# Patient Record
Sex: Female | Born: 2000 | Race: White | Hispanic: No | Marital: Single | Smoking: Never smoker
Health system: Southern US, Community
[De-identification: ages and names within clinical notes are randomized; demographics above are authoritative.]

## PROBLEM LIST (undated history)

## (undated) DIAGNOSIS — J45909 Unspecified asthma, uncomplicated: Secondary | ICD-10-CM

## (undated) DIAGNOSIS — F419 Anxiety disorder, unspecified: Secondary | ICD-10-CM

## (undated) DIAGNOSIS — L309 Dermatitis, unspecified: Secondary | ICD-10-CM

## (undated) HISTORY — PX: TYMPANOSTOMY TUBE PLACEMENT: SHX32

## (undated) HISTORY — DX: Dermatitis, unspecified: L30.9

---
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
## 2022-01-26 ENCOUNTER — Ambulatory Visit (INDEPENDENT_AMBULATORY_CARE_PROVIDER_SITE_OTHER): Payer: Medicaid Other | Admitting: Allergy & Immunology

## 2022-01-26 ENCOUNTER — Encounter: Payer: Self-pay | Admitting: Allergy & Immunology

## 2022-01-26 VITALS — BP 122/76 | HR 98 | Temp 98.9°F | Resp 18 | Ht 64.0 in | Wt 261.2 lb

## 2022-01-26 DIAGNOSIS — J4520 Mild intermittent asthma, uncomplicated: Secondary | ICD-10-CM | POA: Diagnosis not present

## 2022-01-26 DIAGNOSIS — J3089 Other allergic rhinitis: Secondary | ICD-10-CM

## 2022-01-26 DIAGNOSIS — T7800XD Anaphylactic reaction due to unspecified food, subsequent encounter: Secondary | ICD-10-CM

## 2022-01-26 DIAGNOSIS — T7800XA Anaphylactic reaction due to unspecified food, initial encounter: Secondary | ICD-10-CM | POA: Diagnosis not present

## 2022-01-26 MED ORDER — AZELASTINE HCL 0.1 % NA SOLN: 2.0000 | Freq: Two times a day (BID) | NASAL | 5 refills | Status: DC

## 2022-01-26 MED ORDER — MONTELUKAST SODIUM 10 MG PO TABS: 10.0000 mg | ORAL_TABLET | Freq: Every day | ORAL | 5 refills | Status: DC

## 2022-01-26 MED ORDER — CHLORPHENIRAMINE MALEATE ER 12 MG PO TBCR: 12.0000 mg | EXTENDED_RELEASE_TABLET | Freq: Two times a day (BID) | ORAL | 5 refills | Status: DC

## 2022-01-26 MED ORDER — MOMETASONE FUROATE 50 MCG/ACT NA SUSP: 1.0000 | Freq: Every day | NASAL | 5 refills | Status: DC

## 2022-01-26 NOTE — Progress Notes (Signed)
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 Seasonal and perennial allergic rhinitis - Testing today showed: grasses, ragweed, weeds, trees, indoor molds, outdoor molds, dust mites, cat, and dog. - Copy of test results provided.  - Avoidance measures provided. - Stop taking: Flonase and Ryvent - Continue with: Singulair (montelukast) 55m daily - Start taking: Nasonex (momentason) one spray per nostril daily (AIM FOR EAR ON EACH SIDE) and Astelin (azelastine) 2 sprays per nostril 1-2 times daily as needed and ChlorTab 12 mg twice daily  - You can use an extra dose of the antihistamine, if needed, for breakthrough symptoms.  - Consider nasal saline rinses 1-2 times daily to remove allergens from the nasal cavities as well as help with mucous clearance (this is especially helpful to do before the nasal sprays are given) - We will start allergy shots as a means of long-term control. - Allergy shots "re-train" and "reset" the immune system to ignore environmental allergens and decrease the resulting immune response to those allergens (sneezing, itchy watery eyes, runny nose, nasal congestion, etc).    - Allergy shots improve symptoms in 75-85% of patients.   2. 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 - I think that you can introduce this at home without a problem. - we can do a challenge in the office if you are more comfortable with this.   3. Mild intermittent asthma, uncomplicated - Lung testing looked normal today. - We are not going to make any changes at all. - Continue with albuterol as  needed. - We can add medications in the future if needed.  4. Return in about 3 months (around 04/27/2022). Make an appointment to start shots in 3 weeks.    This note in its entirety was forwarded to the Provider who requested this consultation.  Subjective:   Yatziri R Hamblin is a 21y.o. female presenting today for evaluation of  Chief Complaint  Patient presents with   Asthma    Is not bothering    Allergic Rhinitis     Has tried multiple meds and nothing has worked. Has rotated them before. Sneezing, nose stopped, runny nose, headaches, itchy eyes, drainage    Allergy Testing    Dad is allergic to shrimp. Mom avoids giving her shrimp. Has not had shrimp.     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  CBerlinwas referred by HCelene Squibb MD.     CGeralynis a 21y.o. female presenting for an evaluation of environmental allergies .   Asthma/Respiratory Symptom History: Symptoms have not been too terrible. She does endorse some symptoms of SOB when she has congestion. But then she gets drainage and it leads to issues in her lungs. She has been using Vapor rub which has helped.  She first got an inhaler when she was in elementary school. She has been on a number of them over the years.   Allergic Rhinitis Symptom History: She  has had allergies since he was very young. Her best time of the year is the winter. She  can breathe better with the cold air. Symptoms have gotten worse over time. She feels that her symptoms have been particularly bad. She has tried the Ryvent. She takes one tablet at night. 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 She does take Chlortabs every 4 hours, otherwise her symptoms are fairly terrible. This does not knock her out at all. She takes one every morning when she gets up for work. She has  fluticasone that she does not use as recommended. This week has been bad since stopping her medications. She used a NeilMed rinse earlier this week that helped in the moment. But it got worse after that.   She has had skin testing done when she was in [employer] school. She was allergic to a number of items. She had a particularly bad reaction when she was visiting horses and she developed periorbital edema.   She does get antibiotics often for her allergy symptoms. She reports that she would be clear for one month and then it would come.   Food Allergy Symptom History: Her Mom has her avoid shrimp because of a paternal allergy. She does not really like seafood aside from crab legs. She has eaten fin fish in the past but she does not like it.  Mom thinks that she has snuck in some shrimp at some point, but Minahil denies this.   She did have eczema as a child, but this has not been a problem in several years. She had cortisone OTC to treat it. She never saw Dermatology.   Otherwise, there is no history of other atopic diseases, including drug allergies, stinging insect allergies, eczema, urticaria, or contact dermatitis. There is no significant infectious history. Vaccinations are up to date.    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 If you have any questions, ask your nurse or doctor.          STOP taking these medications    Chlorphen 4 MG tablet Generic drug: chlorpheniramine Replaced by: Chlorpheniramine Maleate 12 MG Tbcr Stopped by: Valentina Shaggy, MD       TAKE these medications    acetaminophen 325 MG tablet Commonly  known as: TYLENOL Take 650 mg by mouth every 6 (six) hours as needed for mild pain or headache.   albuterol (2.5 MG/3ML) 0.083% nebulizer solution Commonly known as: PROVENTIL Take 3 mLs (2.5 mg total) by nebulization every 4 (four) hours.   albuterol 108 (90 Base) MCG/ACT inhaler Commonly known as: VENTOLIN HFA Inhale 2 puffs into the lungs every hour as needed for wheezing or shortness of breath.   albuterol 108 (90 Base) MCG/ACT inhaler Commonly known as: VENTOLIN HFA Inhale 1-2 puffs into the lungs every 6 (six) hours as needed for wheezing or shortness of breath.   albuterol (2.5 MG/3ML) 0.083% nebulizer solution Commonly known as: PROVENTIL Take 3 mLs (2.5 mg total) by nebulization every 6 (six) hours as needed for wheezing or shortness of  breath.   Apri 0.15-30 MG-MCG tablet Generic drug: desogestrel-ethinyl estradiol TAKE ONE TABLET BY MOUTH DAILY   azelastine 0.1 % nasal spray Commonly known as: ASTELIN Place 2 sprays into both nostrils 2 (two) times daily. Started by: Valentina Shaggy, MD   beclomethasone 40 MCG/ACT inhaler Commonly known as: QVAR Inhale 2 puffs into the lungs 2 (two) times daily.   cetirizine 10 MG tablet Commonly known as: ZYRTEC Take 10 mg by mouth daily.   Chlorpheniramine Maleate 12 MG Tbcr Take 1 tablet (12 mg total) by mouth in the morning and at bedtime. Replaces: Chlorphen 4 MG tablet Started by: Valentina Shaggy, MD   diphenhydrAMINE 25 MG tablet Commonly known as: SOMINEX Take 25-50 mg by mouth daily.   diphenhydrAMINE 50 MG tablet Commonly known as: BENADRYL Take 50 mg by mouth at bedtime as needed for itching.   HAIR/SKIN/NAILS PO Take by mouth.   levocetirizine 5 MG tablet Commonly known as: XYZAL Take 5 mg by mouth every evening.   levothyroxine 100 MCG tablet Commonly known as: SYNTHROID Take 100 mcg by mouth daily before breakfast.   medroxyPROGESTERone 10 MG tablet Commonly known as: PROVERA Take 1  tablet (10 mg total) by mouth daily.   mometasone 50 MCG/ACT nasal spray Commonly known as: NASONEX Place 1 spray into the nose daily. Started by: Valentina Shaggy, MD   montelukast 10 MG tablet Commonly known as: SINGULAIR Take 1 tablet (10 mg total) by mouth daily.   olopatadine 0.1 % ophthalmic solution Commonly known as: Pataday Place 1 drop into both eyes 2 (two) times daily.   RyVent 6 MG Tabs Generic drug: Carbinoxamine Maleate Take 1 tablet by mouth daily.   Symbicort 80-4.5 MCG/ACT inhaler Generic drug: budesonide-formoterol Inhale 2 puffs into the lungs 2 (two) times daily.        Birth History: born at term without complications. She was born via a planned c/s two weeks before her due date.   Developmental History: Kyrra has met all milestones on time. She has required no speech therapy, occupational therapy, and physical therapy.   Past Surgical History: Past Surgical History:  Procedure Laterality Date   TYMPANOSTOMY TUBE PLACEMENT       Family History: Family History  Problem Relation Age of Onset   Diabetes Mother    Anxiety disorder Mother    Allergic rhinitis Father    Breast cancer Maternal Grandmother    Other Maternal Grandmother        hysterectomy   Kidney failure Maternal Grandmother      Social History: Adiya lives at home with Mom and Dad.  She lives in a house that is 73 years old.  There is hardwood throughout the home.  She has electric heating and central cooling.  There are 2 dogs inside of the home (one pit bull and one dachshund) and rabbits and chickens and turkeys outside of the home.  There are no dust mite covers on the bedding.  There is no tobacco exposure.  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  She is not exposed to fumes, chemicals, or dust.  She does not live near an interstate or industrial area.   Review of Systems  Constitutional: Negative.  Negative for fever, malaise/fatigue and weight  loss.  HENT:  Positive for congestion and sinus pain. Negative for ear discharge and ear pain.        Positive for throat clearing. Positive for postnasal drip.  Eyes:  Negative for pain, discharge and redness.  Respiratory:  Positive for shortness of breath. Negative for cough, sputum production and wheezing.   Cardiovascular: Negative.  Negative for chest pain and palpitations.  Gastrointestinal:  Negative for abdominal pain, heartburn, nausea and vomiting.  Skin: Negative.  Negative for itching and rash.  Neurological:  Negative for dizziness and headaches.  Endo/Heme/Allergies:  Negative for environmental allergies. Does not bruise/bleed easily.       Objective:   Blood pressure 122/76, pulse 98, temperature 98.9 F (37.2 C), resp. rate 18, height 5' 4"  (1.626 m), weight 261 lb 4 oz (118.5 kg), SpO2 96 %. Body mass index is 44.84 kg/m.     Physical Exam Vitals reviewed.  Constitutional:      Appearance: She is well-developed. She is obese.  HENT:     Head: Normocephalic and atraumatic.     Right Ear: Tympanic membrane, ear canal and external ear normal. No drainage, swelling or tenderness. Tympanic membrane is not injected, scarred, erythematous, retracted or bulging.     Left Ear: Tympanic membrane, ear canal and external ear normal. No drainage, swelling or tenderness. Tympanic membrane is not injected, scarred, erythematous, retracted or bulging.     Nose: No nasal deformity, septal deviation, mucosal edema or rhinorrhea.     Right Turbinates: Enlarged, swollen and pale.     Left Turbinates: Enlarged, swollen and pale.     Right Sinus: No maxillary sinus tenderness or frontal sinus tenderness.     Left Sinus: No maxillary sinus tenderness or frontal sinus tenderness.     Mouth/Throat:     Lips: Pink.     Mouth: Mucous membranes are moist. Mucous membranes are not pale and not dry.     Pharynx: Uvula midline.     Comments: Cobblestoning in the posterior oropharynx.  th Eyes:     General:        Right eye: No discharge.        Left eye: No discharge.     Conjunctiva/sclera: Conjunctivae normal.     Right eye: Right conjunctiva is not injected. No chemosis.    Left eye: Left conjunctiva is not injected. No chemosis.    Pupils: Pupils are equal, round, and reactive to light.  Cardiovascular:     Rate and Rhythm: Normal rate and regular rhythm.     Heart sounds: Normal heart sounds.  Pulmonary:     Effort: Pulmonary effort is normal. No tachypnea, accessory muscle usage or respiratory distress.     Breath sounds: Normal breath sounds. No wheezing, rhonchi or rales.     Comments: No wheezing or crackles noted.  Chest:     Chest wall: No tenderness.  Abdominal:     Tenderness: There is no abdominal tenderness. There is no guarding or rebound.  Lymphadenopathy:     Head:     Right side of head: No submandibular, tonsillar or occipital adenopathy.     Left side of head: No submandibular, tonsillar or occipital adenopathy.     Cervical: No cervical adenopathy.  Skin:    Coloration: Skin is not pale.     Findings: No abrasion, erythema, petechiae or rash. Rash is not papular, urticarial or vesicular.  Neurological:     Mental Status: She is alert.  Psychiatric:        Behavior: Behavior is cooperative.      Diagnostic studies:    Spirometry: results normal (FEV1: 2.70/81%, FVC: 3.38/89%, FEV1/FVC: 80%).    Spirometry consistent  with normal pattern.   Allergy Studies:     Airborne Adult Perc - 01/26/22 0928     Time Antigen Placed 6659    Allergen Manufacturer Lavella Hammock    Location Back    Number of Test 59    1. Control-Buffer 50% Glycerol Negative    2. Control-Histamine 1 mg/ml 3+    3. Albumin saline Negative    4. [place] 3+    5. [place] Negative    6. [place] 3+    7. [place] Blue 3+    8. Meadow Fescue Negative    9. Perennial Rye 4+    10. Sweet Vernal 3+    11. Timothy 3+    12. Cocklebur Negative    13. Burweed Marshelder  Negative    14. Ragweed, short Negative    15. Ragweed, Giant Negative    16. Plantain,  English Negative    17. Lamb's Quarters Negative    18. Sheep Sorrell Negative    19. Rough Pigweed Negative    20. Marsh Elder, Rough Negative    21. Mugwort, Common Negative    22. Ash mix Negative    23. Birch mix Negative    24. Beech American Negative    25. Box, Elder Negative    26. Cedar, red Negative    27. Cottonwood, [place] Negative    28. Elm mix Negative    29. Hickory 3+    30. Maple mix Negative    31. Oak, [place] mix Negative    32. Pecan Pollen 4+    33. Pine mix Negative    34. Sycamore Eastern Negative    35. Baldwin, Black Pollen Negative    36. Alternaria alternata Negative    37. Cladosporium Herbarum Negative    38. Aspergillus mix Negative    39. Penicillium mix Negative    40. Bipolaris sorokiniana (Helminthosporium) Negative    41. Drechslera spicifera (Curvularia) Negative    42. Mucor plumbeus 2+    43. Fusarium moniliforme Negative    44. Aureobasidium pullulans (pullulara) Negative    45. Rhizopus oryzae Negative    46. Botrytis cinera Negative    47. Epicoccum nigrum Negative    48. Phoma betae Negative    49. Candida Albicans Negative    50. Trichophyton mentagrophytes Negative    51. Mite, D Farinae  5,000 AU/ml Negative    52. Mite, D Pteronyssinus  5,000 AU/ml Negative    53. Cat Hair 10,000 BAU/ml 3+    54.  Dog Epithelia 3+    55. Mixed Feathers Negative    56. Horse Epithelia Negative    57. Cockroach, German Negative    58. Mouse Negative    59. Tobacco Leaf Negative             Intradermal - 01/26/22 1017     Time Antigen Placed 1017    Allergen Manufacturer Greer    Location Arm    Number of Test 9    Control Negative    Ragweed mix 3+    Weed mix 3+    Tree mix 4+    Mold 1 1+    Mold 4 3+    Cockroach Negative    Mite mix 2+    Comments MM2: 2+             Food Adult Perc - 01/26/22 0900     Time Antigen Placed  9357    Allergen Manufacturer Lavella Hammock  Location Back    Number of allergen test 1    25. Shrimp Negative             Allergy testing results were read and interpreted by myself, documented by clinical staff.         Salvatore Marvel, MD [facility] [place]

## 2022-01-26 NOTE — Patient Instructions (Addendum)
1. Seasonal and perennial allergic rhinitis - Testing today showed: grasses, ragweed, weeds, trees, indoor molds, outdoor molds, dust mites, cat, and dog. - Copy of test results provided.  - Avoidance measures provided. - Stop taking: Flonase and Ryvent - Continue with: Singulair (montelukast) 10mg  daily - Start taking: Nasonex (momentason) one spray per nostril daily (AIM FOR EAR ON EACH SIDE) and Astelin (azelastine) 2 sprays per nostril 1-2 times daily as needed and ChlorTab 12 mg twice daily  - You can use an extra dose of the antihistamine, if needed, for breakthrough symptoms.  - Consider nasal saline rinses 1-2 times daily to remove allergens from the nasal cavities as well as help with mucous clearance (this is especially helpful to do before the nasal sprays are given) - We will start allergy shots as a means of long-term control. - Allergy shots "re-train" and "reset" the immune system to ignore environmental allergens and decrease the resulting immune response to those allergens (sneezing, itchy watery eyes, runny nose, nasal congestion, etc).    - Allergy shots improve symptoms in 75-85% of patients.   2. 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 - I think that you can introduce this at home without a problem. - we can do a challenge in the office if you are more comfortable with this.   3. Mild intermittent asthma, uncomplicated - Lung testing looked normal today. - We are not going to make any changes at all. - Continue with albuterol as needed. - We can add medications in the future if needed.  4. Return in about 3 months (around 04/27/2022). Make an appointment to start shots in 3 weeks.    Please inform 14/10/2021 of any Emergency Department visits, hospitalizations, or changes in symptoms. Call us before going to the ED for breathing or allergy symptoms since we might be able to fit you in for a sick visit. Feel free to contact us anytime with any questions,  problems, or concerns.  It was a pleasure to meet you and your family today!  Websites that have reliable patient information: 1. American Academy of Asthma, Allergy, and Immunology: www.aaaai.org 2. Food Allergy Research and Education (FARE): foodallergy.org 3. Mothers of Asthmatics: http://www.asthmacommunitynetwork.org 4. American College of Allergy, Asthma, and Immunology: www.acaai.org   COVID-19 Vaccine Information can be found at: [place] For questions related to vaccine distribution or appointments, please email [email or web address] or call [phone].   We realize that you might be concerned about having an allergic reaction to the COVID19 vaccines. To help with that concern, WE ARE OFFERING THE COVID19 VACCINES IN OUR OFFICE! Ask the front desk for dates!     "Like" [phone] on Facebook and Instagram for our latest updates!      A healthy democracy works best when [place] participate! Make sure you are registered to vote! If you have moved or changed any of your contact information, you will need to get this updated before voting!  In some cases, you MAY be able to register to vote online: [employer]       Airborne Adult Perc - 01/26/22 0928     Time Antigen Placed 03/28/22    Allergen Manufacturer [id number]    Location Back    Number of Test 59    1. Control-Buffer 50% Glycerol Negative    2. Control-Histamine 1 mg/ml 3+    3. Albumin saline Negative    4. Bahia 3+    5. Waynette Buttery Negative    6.  Johnson 3+    7. Kentucky Blue 3+    8. Meadow Fescue Negative    9. Perennial Rye 4+    10. Sweet Vernal 3+    11. Timothy 3+    12. Cocklebur Negative    13. Burweed Marshelder Negative    14. Ragweed, short Negative    15. Ragweed, Giant Negative    16. Plantain,  English Negative    17. Lamb's Quarters Negative    18. Sheep Sorrell Negative    19. Rough Pigweed  Negative    20. Marsh Elder, Rough Negative    21. Mugwort, Common Negative    22. Ash mix Negative    23. Birch mix Negative    24. Beech American Negative    25. Box, Elder Negative    26. Cedar, red Negative    27. Cottonwood, Guinea-Bissau Negative    28. Elm mix Negative    29. Hickory 3+    30. Maple mix Negative    31. Oak, Guinea-[place]issau mix Negative    32. Pecan Pollen 4+    33. Pine mix Negative    34. Sycamore Eastern Negative    35. Walnut, Black Pollen Negative    36. Alternaria alternata Negative    37. Cladosporium Herbarum Negative    38. Aspergillus mix Negative    39. Penicillium mix Negative    40. Bipolaris sorokiniana (Helminthosporium) Negative    41. Drechslera spicifera (Curvularia) Negative    42. Mucor plumbeus 2+    43. Fusarium moniliforme Negative    44. Aureobasidium pullulans (pullulara) Negative    45. Rhizopus oryzae Negative    46. Botrytis cinera Negative    47. Epicoccum nigrum Negative    48. Phoma betae Negative    49. Candida Albicans Negative    50. Trichophyton mentagrophytes Negative    51. Mite, D Farinae  5,000 AU/ml Negative    52. Mite, D Pteronyssinus  5,000 AU/ml Negative    53. Cat Hair 10,000 BAU/ml 3+    54.  Dog Epithelia 3+    55. Mixed Feathers Negative    56. Horse Epithelia Negative    57. Cockroach, German Negative    58. Mouse Negative    59. Tobacco Leaf Negative             Intradermal - 01/26/22 1017     Time Antigen Placed 1017    Allergen Manufacturer Greer    Location Arm    Number of Test 9    Control Negative    Ragweed mix 3+    Weed mix 3+    Tree mix 4+    Mold 1 1+    Mold 4 3+    Cockroach Negative    Mite mix 2+    Comments MM2: 2+             Food Adult Perc - 01/26/22 0900     Time Antigen Placed 4098    Allergen Manufacturer Waynette Buttery    Location Back    Number of allergen test 1    25. Shrimp Negative             Allergy Shots   Allergies are the result of a chain reaction  that starts in the immune system. Your immune system controls how your body defends itself. For instance, if you have an allergy to pollen, your immune system identifies pollen as an invader or allergen. Your immune system overreacts by producing antibodies called  Immunoglobulin E (IgE). These antibodies travel to cells that release chemicals, causing an allergic reaction.  The concept behind allergy immunotherapy, whether it is received in the form of shots or tablets, is that the immune system can be desensitized to specific allergens that trigger allergy symptoms. Although it requires time and patience, the payback can be long-term relief.  How Do Allergy Shots Work?  Allergy shots work much like a vaccine. Your body responds to injected amounts of a particular allergen given in increasing doses, eventually developing a resistance and tolerance to it. Allergy shots can lead to decreased, minimal or no allergy symptoms.  There generally are two phases: build-up and maintenance. Build-up often ranges from three to six months and involves receiving injections with increasing amounts of the allergens. The shots are typically given once or twice a week, though more rapid build-up schedules are sometimes used.  The maintenance phase begins when the most effective dose is reached. This dose is different for each person, depending on how allergic you are and your response to the build-up injections. Once the maintenance dose is reached, there are longer periods between injections, typically two to four weeks.  Occasionally doctors give cortisone-type shots that can temporarily reduce allergy symptoms. These types of shots are different and should not be confused with allergy immunotherapy shots.  Who Can Be Treated with Allergy Shots?  Allergy shots may be a good treatment approach for people with allergic rhinitis (hay fever), allergic asthma, conjunctivitis (eye allergy) or stinging insect allergy.    Before deciding to begin allergy shots, you should consider:   The length of allergy season and the severity of your symptoms  Whether medications and/or changes to your environment can control your symptoms  Your desire to avoid long-term medication use  Time: allergy immunotherapy requires a major time commitment  Cost: may vary depending on your insurance coverage  Allergy shots for children age 56 and older are effective and often well tolerated. They might prevent the onset of new allergen sensitivities or the progression to asthma.  Allergy shots are not started on patients who are pregnant but can be continued on patients who become pregnant while receiving them. In some patients with other medical conditions or who take certain common medications, allergy shots may be of risk. It is important to mention other medications you talk to your allergist.   When Will I Feel Better?  Some may experience decreased allergy symptoms during the build-up phase. For others, it may take as long as 12 months on the maintenance dose. If there is no improvement after a year of maintenance, your allergist will discuss other treatment options with you.  If you aren't responding to allergy shots, it may be because there is not enough dose of the allergen in your vaccine or there are missing allergens that were not identified during your allergy testing. Other reasons could be that there are high levels of the allergen in your environment or major exposure to non-allergic triggers like tobacco smoke.  What Is the Length of Treatment?  Once the maintenance dose is reached, allergy shots are generally continued for three to five years. The decision to stop should be discussed with your allergist at that time. Some people may experience a permanent reduction of allergy symptoms. Others may relapse and a longer course of allergy shots can be considered.  What Are the Possible Reactions?  The two types of  adverse reactions that can occur with allergy shots are local and  systemic. Common local reactions include very mild redness and swelling at the injection site, which can happen immediately or several hours after. A systemic reaction, which is less common, affects the entire body or a particular body system. They are usually mild and typically respond quickly to medications. Signs include increased allergy symptoms such as sneezing, a stuffy nose or hives.  Rarely, a serious systemic reaction called anaphylaxis can develop. Symptoms include swelling in the throat, wheezing, a feeling of tightness in the chest, nausea or dizziness. Most serious systemic reactions develop within 30 minutes of allergy shots. This is why it is strongly recommended you wait in your doctor's office for 30 minutes after your injections. Your allergist is trained to watch for reactions, and his or her staff is trained and equipped with the proper medications to identify and treat them.  Who Should Administer Allergy Shots?  The preferred location for receiving shots is your prescribing allergist's office. Injections can sometimes be given at another facility where the physician and staff are trained to recognize and treat reactions, and have received instructions by your prescribing allergist.  Reducing Pollen Exposure  The American Academy of Allergy, Asthma and Immunology suggests the following steps to reduce your exposure to pollen during allergy seasons.    Do not hang sheets or clothing out to dry; pollen may collect on these items. Do not mow lawns or spend time around freshly cut grass; mowing stirs up pollen. Keep windows closed at night.  Keep car windows closed while driving. Minimize morning activities outdoors, a time when pollen counts are usually at their highest. Stay indoors as much as possible when pollen counts or humidity is high and on windy days when pollen tends to remain in the air longer. Use air  conditioning when possible.  Many air conditioners have filters that trap the pollen spores. Use a HEPA room air filter to remove pollen form the indoor air you breathe.   Control of Mold Allergen   Mold and fungi can grow on a variety of surfaces provided certain temperature and moisture conditions exist.  Outdoor molds grow on plants, decaying vegetation and soil.  The major outdoor mold, Alternaria and Cladosporium, are found in very high numbers during hot and dry conditions.  Generally, a late Summer - Fall peak is seen for common outdoor fungal spores.  Rain will temporarily lower outdoor mold spore count, but counts rise rapidly when the rainy period ends.  The most important indoor molds are Aspergillus and Penicillium.  Dark, humid and poorly ventilated basements are ideal sites for mold growth.  The next most common sites of mold growth are the bathroom and the kitchen.  Outdoor (Seasonal) Mold Control   Use air conditioning and keep windows closed Avoid exposure to decaying vegetation. Avoid leaf raking. Avoid grain handling. Consider wearing a face mask if working in moldy areas.    Indoor (Perennial) Mold Control    Maintain humidity below 50%. Clean washable surfaces with 5% bleach solution. Remove sources e.g. contaminated carpets.     Control of Dust Mite Allergen    Dust mites play a major role in allergic asthma and rhinitis.  They occur in environments with high humidity wherever human skin is found.  Dust mites absorb humidity from the atmosphere (ie, they do not drink) and feed on organic matter (including shed human and animal skin).  Dust mites are a microscopic type of insect that you cannot see with the naked eye.  High levels of  dust mites have been detected from mattresses, pillows, carpets, upholstered furniture, bed covers, clothes, soft toys and any woven material.  The principal allergen of the dust mite is found in its feces.  A gram of dust may contain  1,000 mites and 250,000 fecal particles.  Mite antigen is easily measured in the air during house cleaning activities.  Dust mites do not bite and do not cause harm to humans, other than by triggering allergies/asthma.    Ways to decrease your exposure to dust mites in your home:  Encase mattresses, box springs and pillows with a mite-impermeable barrier or cover   Wash sheets, blankets and drapes weekly in hot water (130 F) with detergent and dry them in a dryer on the hot setting.  Have the room cleaned frequently with a vacuum cleaner and a damp dust-mop.  For carpeting or rugs, vacuuming with a vacuum cleaner equipped with a high-efficiency particulate air (HEPA) filter.  The dust mite allergic individual should not be in a room which is being cleaned and should wait 1 hour after cleaning before going into the room. Do not sleep on upholstered furniture (eg, couches).   If possible removing carpeting, upholstered furniture and drapery from the home is ideal.  Horizontal blinds should be eliminated in the rooms where the person spends the most time (bedroom, study, television room).  Washable vinyl, roller-type shades are optimal. Remove all non-washable stuffed toys from the bedroom.  Wash stuffed toys weekly like sheets and blankets above.   Reduce indoor humidity to less than 50%.  Inexpensive humidity monitors can be purchased at most hardware stores.  Do not use a humidifier as can make the problem worse and are not recommended.   Control of Dog or Cat Allergen  Avoidance is the best way to manage a dog or cat allergy. If you have a dog or cat and are allergic to dog or cats, consider removing the dog or cat from the home. If you have a dog or cat but don't want to find it a new home, or if your family wants a pet even though someone in the household is allergic, here are some strategies that may help keep symptoms at bay:  Keep the pet out of your bedroom and restrict it to only a few  rooms. Be advised that keeping the dog or cat in only one room will not limit the allergens to that room. Don't pet, hug or kiss the dog or cat; if you do, wash your hands with soap and water. High-efficiency particulate air (HEPA) cleaners run continuously in a bedroom or living room can reduce allergen levels over time. Regular use of a high-efficiency vacuum cleaner or a central vacuum can reduce allergen levels. Giving your dog or cat a bath at least once a week can reduce airborne allergen.

## 2022-01-27 DIAGNOSIS — J3081 Allergic rhinitis due to animal (cat) (dog) hair and dander: Secondary | ICD-10-CM | POA: Diagnosis not present

## 2022-01-27 NOTE — Progress Notes (Signed)
VIALS EXP 01-28-23 

## 2022-01-27 NOTE — Progress Notes (Signed)
Aeroallergen Immunotherapy   Ordering Provider: Dr. Malachi Bonds   Patient Details  Name: Sara Byrd  MRN: [id number]  Date of Birth: 08-27-2000   Order 2 of 2   Vial Label: RW/Molds   0.3 ml (Volume)  1:20 Concentration -- Ragweed Mix  0.2 ml (Volume)  1:20 Concentration -- Alternaria alternata  0.2 ml (Volume)  1:20 Concentration -- Cladosporium herbarum  0.2 ml (Volume)  1:10 Concentration -- Aspergillus mix  0.2 ml (Volume)  1:10 Concentration -- Penicillium mix  0.2 ml (Volume)  1:10 Concentration -- Mucor plumbeus  0.2 ml (Volume)  1:10 Concentration -- Fusarium moniliforme  0.2 ml (Volume)  1:40 Concentration -- Aureobasidium pullulans  0.2 ml (Volume)  1:10 Concentration -- Rhizopus oryzae    1.9  ml Extract Subtotal  3.1  ml Diluent  5.0  ml Maintenance Total   Schedule:  A   Blue Vial (1:100,000): Schedule A (10 doses)  Yellow Vial (1:10,000): Schedule A (10 doses)  Green Vial (1:1,000): Schedule A (10 doses)  Red Vial (1:100): Schedule A (14 doses)   Special Instructions: none

## 2022-01-27 NOTE — Progress Notes (Signed)
Aeroallergen Immunotherapy   Ordering Provider: Dr. Malachi Bonds   Patient Details  Name: Sara Byrd  MRN: [id number]  Date of Birth: [birth year]   Order 1 of 2   Vial Label: G/W/T/C/D   0.3 ml (Volume)  BAU Concentration -- 7 Grass Mix* 100,000 ([address] [place], [place], [place], Perennial Rye, RedTop, Sweet Vernal, Timothy)  0.2 ml (Volume)  1:20 Concentration -- Bahia  0.2 ml (Volume)  1:20 Concentration -- Johnson  0.5 ml (Volume)  1:20 Concentration -- Weed Mix*  0.5 ml (Volume)  1:20 Concentration -- Eastern 10 Tree Mix (also Sweet Gum)  0.2 ml (Volume)  1:10 Concentration -- Hickory*  0.2 ml (Volume)  1:10 Concentration -- Pecan Pollen  0.5 ml (Volume)  1:10 Concentration -- Cat Hair  0.7 ml (Volume)  1:10 Concentration -- Dog Epithelia  0.7 ml (Volume)   AU Concentration -- Mite Mix (DF 5,000 & DP 5,000)    4.0  ml Extract Subtotal  1.0  ml Diluent  5.0  ml Maintenance Total   Schedule:  A   Blue Vial (1:100,000): Schedule A (10 doses)  Yellow Vial (1:10,000): Schedule A (10 doses)  Green Vial (1:1,000): Schedule A (10 doses)  Red Vial (1:100): Schedule A (14 doses)   Special Instructions: none

## 2022-01-28 DIAGNOSIS — J301 Allergic rhinitis due to pollen: Secondary | ICD-10-CM | POA: Diagnosis not present

## 2022-01-31 ENCOUNTER — Other Ambulatory Visit: Payer: Self-pay | Admitting: Obstetrics & Gynecology

## 2022-02-04 ENCOUNTER — Other Ambulatory Visit: Payer: Self-pay | Admitting: *Deleted

## 2022-02-04 MED ORDER — DESOGESTREL-ETHINYL ESTRADIOL 0.15-30 MG-MCG PO TABS: 1.0000 | ORAL_TABLET | Freq: Every day | ORAL | 12 refills | Status: DC

## 2022-02-16 ENCOUNTER — Ambulatory Visit: Payer: Medicaid Other

## 2022-02-18 ENCOUNTER — Ambulatory Visit (INDEPENDENT_AMBULATORY_CARE_PROVIDER_SITE_OTHER): Payer: Medicaid Other

## 2022-02-18 DIAGNOSIS — J309 Allergic rhinitis, unspecified: Secondary | ICD-10-CM

## 2022-02-18 MED ORDER — EPINEPHRINE 0.3 MG/0.3ML IJ SOAJ: 0.3000 mg | INTRAMUSCULAR | 1 refills | Status: AC | PRN

## 2022-02-18 NOTE — Progress Notes (Signed)
Immunotherapy   Patient Details  Name: Sara Byrd MRN: [id number] Date of Birth: 08/10/2000  02/18/2022  [place] started injections for  Rag weeds, molds, grass, weeds, tree, cats , and dogs.  Following schedule: A  Frequency:1 time per week Epi-Pen:Prescription for Epi-Pen given Consent signed previously and patient instructions given. Patient waited for thirty minutes in the lobby without an issue.   Julius Bowels 02/18/2022, 10:02 AM

## 2022-02-23 ENCOUNTER — Ambulatory Visit (INDEPENDENT_AMBULATORY_CARE_PROVIDER_SITE_OTHER): Payer: Medicaid Other

## 2022-02-23 DIAGNOSIS — J309 Allergic rhinitis, unspecified: Secondary | ICD-10-CM | POA: Diagnosis not present

## 2022-03-02 ENCOUNTER — Ambulatory Visit (INDEPENDENT_AMBULATORY_CARE_PROVIDER_SITE_OTHER): Payer: Medicaid Other | Admitting: *Deleted

## 2022-03-02 DIAGNOSIS — J309 Allergic rhinitis, unspecified: Secondary | ICD-10-CM

## 2022-03-09 ENCOUNTER — Ambulatory Visit (INDEPENDENT_AMBULATORY_CARE_PROVIDER_SITE_OTHER): Payer: Medicaid Other

## 2022-03-09 DIAGNOSIS — J309 Allergic rhinitis, unspecified: Secondary | ICD-10-CM | POA: Diagnosis not present

## 2022-03-23 ENCOUNTER — Ambulatory Visit (INDEPENDENT_AMBULATORY_CARE_PROVIDER_SITE_OTHER): Payer: Medicaid Other | Admitting: *Deleted

## 2022-03-23 DIAGNOSIS — J309 Allergic rhinitis, unspecified: Secondary | ICD-10-CM

## 2022-03-30 ENCOUNTER — Ambulatory Visit (INDEPENDENT_AMBULATORY_CARE_PROVIDER_SITE_OTHER): Payer: Medicaid Other

## 2022-03-30 DIAGNOSIS — J309 Allergic rhinitis, unspecified: Secondary | ICD-10-CM

## 2022-04-06 ENCOUNTER — Ambulatory Visit (INDEPENDENT_AMBULATORY_CARE_PROVIDER_SITE_OTHER): Payer: Medicaid Other

## 2022-04-06 DIAGNOSIS — J309 Allergic rhinitis, unspecified: Secondary | ICD-10-CM | POA: Diagnosis not present

## 2022-04-21 ENCOUNTER — Ambulatory Visit: Payer: Medicaid Other | Attending: Cardiology | Admitting: Cardiology

## 2022-04-21 ENCOUNTER — Encounter: Payer: Self-pay | Admitting: Cardiology

## 2022-04-21 VITALS — BP 118/70 | HR 77 | Ht 64.0 in | Wt 250.4 lb

## 2022-04-21 DIAGNOSIS — R002 Palpitations: Secondary | ICD-10-CM

## 2022-04-21 DIAGNOSIS — R000 Tachycardia, unspecified: Secondary | ICD-10-CM

## 2022-04-21 NOTE — Progress Notes (Signed)
Clinical Summary Sara Byrd is a 21 y.o.female seen as a new consult, referred by NP Mare Ferrari for the following medical problems.  1.Tachycardia/Palpitaitons  - 02/2022 TSH normal, K 4.5, Hgb 12.8  - went to urgent care last week.  - at football game had walked up large hill, sat down and heart kept racing. Was also feeling anxious at that time. Some ongoign symptoms overnight.  - high caffeine that day. Grande coffee from [employer], tea, soda that day. - reports long history of anxiety, just started medications last week    2. Anxiety -just started buspar and sertraline    Past Medical History:  Diagnosis Date   Asthma    Eczema      Allergies  Allergen Reactions   Molds & Smuts Other (See Comments)    Asthma  Other reaction(s): Other (See Comments) Asthma    Shrimp Extract Allergy Skin Test      Current Outpatient Medications  Medication Sig Dispense Refill   acetaminophen (TYLENOL) 325 MG tablet Take 650 mg by mouth every 6 (six) hours as needed for mild pain or headache. (Patient not taking: Reported on 01/16/2020)     albuterol (PROVENTIL HFA;VENTOLIN HFA) 108 (90 BASE) MCG/ACT inhaler Inhale 2 puffs into the lungs every hour as needed for wheezing or shortness of breath. 1 Inhaler 0   albuterol (PROVENTIL HFA;VENTOLIN HFA) 108 (90 Base) MCG/ACT inhaler Inhale 1-2 puffs into the lungs every 6 (six) hours as needed for wheezing or shortness of breath. 1 Inhaler 0   albuterol (PROVENTIL) (2.5 MG/3ML) 0.083% nebulizer solution Take 3 mLs (2.5 mg total) by nebulization every 4 (four) hours. 75 mL 0   albuterol (PROVENTIL) (2.5 MG/3ML) 0.083% nebulizer solution Take 3 mLs (2.5 mg total) by nebulization every 6 (six) hours as needed for wheezing or shortness of breath. 75 mL 12   azelastine (ASTELIN) 0.1 % nasal spray Place 2 sprays into both nostrils 2 (two) times daily. 30 mL 5   beclomethasone (QVAR) 40 MCG/ACT inhaler Inhale 2 puffs into the lungs 2 (two) times  daily. (Patient not taking: Reported on 01/02/2020)     Biotin w/ Vitamins C & E (HAIR/SKIN/NAILS PO) Take by mouth.     cetirizine (ZYRTEC) 10 MG tablet Take 10 mg by mouth daily. (Patient not taking: Reported on 01/02/2020)     Chlorpheniramine Maleate 12 MG TBCR Take 1 tablet (12 mg total) by mouth in the morning and at bedtime. 60 tablet 5   desogestrel-ethinyl estradiol (APRI) 0.15-30 MG-MCG tablet Take 1 tablet by mouth daily. 28 tablet 12   diphenhydrAMINE (BENADRYL) 50 MG tablet Take 50 mg by mouth at bedtime as needed for itching. (Patient not taking: Reported on 01/26/2022)     diphenhydrAMINE (SOMINEX) 25 MG tablet Take 25-50 mg by mouth daily. (Patient not taking: Reported on 01/02/2020)     EPINEPHrine (EPIPEN 2-PAK) 0.3 mg/0.3 mL IJ SOAJ injection Inject 0.3 mg into the muscle as needed for anaphylaxis. 2 each 1   levocetirizine (XYZAL) 5 MG tablet Take 5 mg by mouth every evening. (Patient not taking: Reported on 01/26/2022)     levothyroxine (SYNTHROID) 100 MCG tablet Take 100 mcg by mouth daily before breakfast. (Patient not taking: Reported on 01/26/2022)     medroxyPROGESTERone (PROVERA) 10 MG tablet Take 1 tablet (10 mg total) by mouth daily. (Patient not taking: Reported on 01/16/2020) 10 tablet 0   mometasone (NASONEX) 50 MCG/ACT nasal spray Place 1 spray into the nose  daily. 17 g 5   montelukast (SINGULAIR) 10 MG tablet Take 1 tablet (10 mg total) by mouth daily. 30 tablet 5   olopatadine (PATADAY) 0.1 % ophthalmic solution Place 1 drop into both eyes 2 (two) times daily. (Patient not taking: Reported on 01/26/2022) 5 mL 12   RYVENT 6 MG TABS Take 1 tablet by mouth daily.     SYMBICORT 80-4.5 MCG/ACT inhaler Inhale 2 puffs into the lungs 2 (two) times daily.     No current facility-administered medications for this visit.     Past Surgical History:  Procedure Laterality Date   TYMPANOSTOMY TUBE PLACEMENT       Allergies  Allergen Reactions   Molds & Smuts Other (See Comments)     Asthma  Other reaction(s): Other (See Comments) Asthma    Shrimp Extract Allergy Skin Test       Family History  Problem Relation Age of Onset   Diabetes Mother    Anxiety disorder Mother    Allergic rhinitis Father    Breast cancer Maternal Grandmother    Other Maternal Grandmother        hysterectomy   Kidney failure Maternal Grandmother      Social History Ms. Wolanski reports that she has never smoked. She has been exposed to tobacco smoke. She has never used smokeless tobacco. Ms. Raffo reports no history of alcohol use.   Review of Systems CONSTITUTIONAL: No weight loss, fever, chills, weakness or fatigue.  HEENT: Eyes: No visual loss, blurred vision, double vision or yellow sclerae.No hearing loss, sneezing, congestion, runny nose or sore throat.  SKIN: No rash or itching.  CARDIOVASCULAR: per hpi RESPIRATORY: No shortness of breath, cough or sputum.  GASTROINTESTINAL: No anorexia, nausea, vomiting or diarrhea. No abdominal pain or blood.  GENITOURINARY: No burning on urination, no polyuria NEUROLOGICAL: No headache, dizziness, syncope, paralysis, ataxia, numbness or tingling in the extremities. No change in bowel or bladder control.  MUSCULOSKELETAL: No muscle, back pain, joint pain or stiffness.  LYMPHATICS: No enlarged nodes. No history of splenectomy.  PSYCHIATRIC: +anxiety ENDOCRINOLOGIC: No reports of sweating, cold or heat intolerance. No polyuria or polydipsia.  Marland Kitchen   Physical Examination Today's Vitals   04/21/22 1035  BP: 118/70  Pulse: 77  SpO2: 99%  Weight: 250 lb 6.4 oz (113.6 kg)  Height: 5\' 4"  (1.626 m)   Body mass index is 42.98 kg/m.  Gen: resting comfortably, no acute distress HEENT: no scleral icterus, pupils equal round and reactive, no palptable cervical adenopathy,  CV: RRR, no m/r/ gno jvd Resp: Clear to auscultation bilaterally GI: abdomen is soft, non-tender, non-distended, normal bowel sounds, no hepatosplenomegaly MSK:  extremities are warm, no edema.  Skin: warm, no rash Neuro:  no focal deficits Psych: appropriate affect   Diagnostic Studies     Assessment and Plan  1.Tachycardia/palpitations - infrequent episodes typically associated with anxiety, one episode relatively high caffeine intake. - overall symptoms most suggestive of cardaic response to anxiety, do not suspect primary cardiac arrhythmia. If symptoms progress particularly as her anxiety is treated could consider outpatient monitor. Work to limit caffeine. - EKG today shows NSR  F/u 6 months      , M.D..

## 2022-04-21 NOTE — Patient Instructions (Signed)
Medication Instructions:  Continue all current medications.   Labwork: none  Testing/Procedures: none  Follow-Up: 6 months   Any Other Special Instructions Will Be Listed Below (If Applicable).   If you need a refill on your cardiac medications before your next appointment, please call your pharmacy.  

## 2022-04-28 NOTE — Progress Notes (Signed)
[address] [place] [address] Dept: [phone]  FOLLOW UP NOTE  Patient ID: Sara Byrd, female    DOB: 08/29/2000  Age: 21 y.o. MRN: [id number] Date of Office Visit: 04/29/2022  Assessment  Chief Complaint: Allergic Rhinitis  (Have been doing better than before )  HPI Sara Byrd is a 21 year old female who presents to the clinic for follow-up visit.  She was last seen in this clinic on 01/26/2022 by Dr. 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 At today's visit, she reports that her symptoms of allergic rhintiis has been more well controlled since her last visit. She reports symptoms including clear rhinorrhea, nasal congestion, sneezing, and post nasal drainage. She continues montelukast 10 mg once a day, ChlorTab once or twice a day, and occasional azelastine. She is not currently using a saline nasal spray or steroid nasal spray. She continues allergy injections directed toward pollens, mold, and pet with no large or local reactions. She continues to avoid shrimp, however, continues to tolerate food cooked with shrimp with no adverse effects. Asthma is reported as moderately well controlled with shortness of breath as the main symptoms. She reports that she has been experiencing anxiety over the last several months and has experience symptoms including shortness of breath and tachycardia for which she uses beep breathing exercises for resolution. She started using Symbicort once a day about 1-2 months ago without relief of symptoms. She visited Dr. Wyline Mood, cardiology specialist, on 04/21/2022 at which time she had a normal EKG reading. Her current medications are listed in the chart.    Drug Allergies:  Allergies  Allergen Reactions   Molds & Smuts Other (See Comments)    Asthma  Other reaction(s): Other (See Comments) Asthma    Shrimp Extract Allergy Skin  Test     Physical Exam: BP 110/68   Pulse (!) 111   Temp 98 F (36.7 C)   Resp 18   SpO2 98%    Physical Exam Vitals reviewed.  Constitutional:      Appearance: Normal appearance.  HENT:     Head: Normocephalic and atraumatic.     Right Ear: Tympanic membrane normal.     Left Ear: Tympanic membrane normal.     Nose:     Comments: Bilateral nares edematous and pale with clear nasal drainage noted. Pharynx slightly erythematous with no exudate. Ears normal. Eyes normal. Eyes:     Conjunctiva/sclera: Conjunctivae normal.  Cardiovascular:     Rate and Rhythm: Normal rate and regular rhythm.     Heart sounds: Normal heart sounds. No murmur heard. Pulmonary:     Effort: Pulmonary effort is normal.     Breath sounds: Normal breath sounds.     Comments: Lungs clear to auscultaiton Musculoskeletal:        General: Normal range of motion.     Cervical back: Normal range of motion and neck supple.  Skin:    General: Skin is warm and dry.  Neurological:     Mental Status: She is alert and oriented to person, place, and time.  Psychiatric:        Mood and Affect: Mood normal.        Behavior: Behavior normal.        Thought Content: Thought content normal.        Judgment: Judgment normal.     Diagnostics: FVC 3.41, FEV1 2.88.  Predicted  FVC 3.79, predicted FEV1 3.33.  Spirometry indicates normal ventilatory function.  Assessment and Plan: 1. Mild intermittent asthma, uncomplicated   2. Seasonal and perennial allergic rhinitis   3. Anaphylactic shock due to food, subsequent encounter     Meds ordered this encounter  Medications   levalbuterol (XOPENEX HFA) 45 MCG/ACT inhaler    Sig: Inhale 2 puffs into the lungs every 4 (four) hours as needed for wheezing.    Dispense:  15 g    Refill:  1   SYMBICORT 80-4.5 MCG/ACT inhaler    Sig: Inhale 2 puffs into the lungs 2 (two) times daily.    Dispense:  10.2 g    Refill:  5    Patient Instructions  Asthma Continue  montelukast 10 mg once a day to prevent cough or wheeze Begin levalbuterol 2 puffs once every 4 -6 hours as needed for cough or wheeze.  This will replace albuterol For asthma flare, begin Symbicort 80-2 puffs twice a day for 2 weeks or until cough and wheeze free  Allergic rhinitis Continue allergen avoidance measures directed toward pollen, cat, dog, and mold as listed below Continue allergen immunotherapy and have access to an epinephrine autoinjector set Continue Chlortab up to twice a day as needed for runny nose or itch Continue azelastine 2 sprays in each nostril twice a day as needed for runny nose Continue Nasonex 2 sprays in each nostril once a day as needed for stuffy nose Consider saline nasal rinses as needed for nasal symptoms. Use this before any medicated nasal sprays for best result  Food allergy Testing to shrimp was negative at your last visit.  You may introduce this food at home if interested  Call the clinic if this treatment plan is not working well for you.  Follow up in 6 months or sooner if needed.   Return in about 6 months (around 10/29/2022), or if symptoms worsen or fail to improve.    Thank you for the opportunity to care for this patient.  Please do not hesitate to contact me with questions.  Thermon Leyland, FNP [facility]

## 2022-04-29 ENCOUNTER — Encounter: Payer: Self-pay | Admitting: Family Medicine

## 2022-04-29 ENCOUNTER — Other Ambulatory Visit: Payer: Self-pay

## 2022-04-29 ENCOUNTER — Ambulatory Visit (INDEPENDENT_AMBULATORY_CARE_PROVIDER_SITE_OTHER): Payer: Medicaid Other | Admitting: Family Medicine

## 2022-04-29 VITALS — BP 110/68 | HR 111 | Temp 98.0°F | Resp 18

## 2022-04-29 DIAGNOSIS — J4520 Mild intermittent asthma, uncomplicated: Secondary | ICD-10-CM | POA: Insufficient documentation

## 2022-04-29 DIAGNOSIS — T7800XA Anaphylactic reaction due to unspecified food, initial encounter: Secondary | ICD-10-CM | POA: Insufficient documentation

## 2022-04-29 DIAGNOSIS — J302 Other seasonal allergic rhinitis: Secondary | ICD-10-CM | POA: Insufficient documentation

## 2022-04-29 DIAGNOSIS — T7800XD Anaphylactic reaction due to unspecified food, subsequent encounter: Secondary | ICD-10-CM | POA: Diagnosis not present

## 2022-04-29 DIAGNOSIS — J3089 Other allergic rhinitis: Secondary | ICD-10-CM

## 2022-04-29 MED ORDER — SYMBICORT 80-4.5 MCG/ACT IN AERO: 2.0000 | INHALATION_SPRAY | Freq: Two times a day (BID) | RESPIRATORY_TRACT | 5 refills | Status: AC

## 2022-04-29 MED ORDER — LEVALBUTEROL TARTRATE 45 MCG/ACT IN AERO: 2.0000 | INHALATION_SPRAY | RESPIRATORY_TRACT | 1 refills | Status: DC | PRN

## 2022-04-29 NOTE — Patient Instructions (Addendum)
Asthma Continue montelukast 10 mg once a day to prevent cough or wheeze Begin levalbuterol 2 puffs once every 4 -6 hours as needed for cough or wheeze.  This will replace albuterol For asthma flare, begin Symbicort 80-2 puffs twice a day for 2 weeks or until cough and wheeze free  Allergic rhinitis Continue allergen avoidance measures directed toward pollen, cat, dog, and mold as listed below Continue allergen immunotherapy and have access to an epinephrine autoinjector set Continue Chlortab up to twice a day as needed for runny nose or itch Continue azelastine 2 sprays in each nostril twice a day as needed for runny nose Continue Nasonex 2 sprays in each nostril once a day as needed for stuffy nose Consider saline nasal rinses as needed for nasal symptoms. Use this before any medicated nasal sprays for best result  Food allergy Testing to shrimp was negative at your last visit.  You may introduce this food at home if interested  Call the clinic if this treatment plan is not working well for you.  Follow up in 6 months or sooner if needed.  Reducing Pollen Exposure The American Academy of Allergy, Asthma and Immunology suggests the following steps to reduce your exposure to pollen during allergy seasons. Do not hang sheets or clothing out to dry; pollen may collect on these items. Do not mow lawns or spend time around freshly cut grass; mowing stirs up pollen. Keep windows closed at night.  Keep car windows closed while driving. Minimize morning activities outdoors, a time when pollen counts are usually at their highest. Stay indoors as much as possible when pollen counts or humidity is high and on windy days when pollen tends to remain in the air longer. Use air conditioning when possible.  Many air conditioners have filters that trap the pollen spores. Use a HEPA room air filter to remove pollen form the indoor air you breathe.  Control of Dog or Cat Allergen Avoidance is the best  way to manage a dog or cat allergy. If you have a dog or cat and are allergic to dog or cats, consider removing the dog or cat from the home. If you have a dog or cat but don't want to find it a new home, or if your family wants a pet even though someone in the household is allergic, here are some strategies that may help keep symptoms at bay:  Keep the pet out of your bedroom and restrict it to only a few rooms. Be advised that keeping the dog or cat in only one room will not limit the allergens to that room. Don't pet, hug or kiss the dog or cat; if you do, wash your hands with soap and water. High-efficiency particulate air (HEPA) cleaners run continuously in a bedroom or living room can reduce allergen levels over time. Regular use of a high-efficiency vacuum cleaner or a central vacuum can reduce allergen levels. Giving your dog or cat a bath at least once a week can reduce airborne allergen.  Control of Mold Allergen Mold and fungi can grow on a variety of surfaces provided certain temperature and moisture conditions exist.  Outdoor molds grow on plants, decaying vegetation and soil.  The major outdoor mold, Alternaria and Cladosporium, are found in very high numbers during hot and dry conditions.  Generally, a late Summer - Fall peak is seen for common outdoor fungal spores.  Rain will temporarily lower outdoor mold spore count, but counts rise rapidly when the rainy period  ends.  The most important indoor molds are Aspergillus and Penicillium.  Dark, humid and poorly ventilated basements are ideal sites for mold growth.  The next most common sites of mold growth are the bathroom and the kitchen.  Outdoor [employer] Use air conditioning and keep windows closed Avoid exposure to decaying vegetation. Avoid leaf raking. Avoid grain handling. Consider wearing a face mask if working in moldy areas.  Indoor Mold Control Maintain humidity below 50%. Clean washable surfaces with 5% bleach  solution. Remove sources e.g. Contaminated carpets.

## 2022-05-17 ENCOUNTER — Ambulatory Visit
Admission: RE | Admit: 2022-05-17 | Discharge: 2022-05-17 | Disposition: A | Discharge status: Home / Self Care | Payer: Medicaid Other | Source: Ambulatory Visit | Attending: Family Medicine | Admitting: Family Medicine

## 2022-05-17 VITALS — BP 152/92 | HR 102 | Temp 98.6°F | Resp 20

## 2022-05-17 DIAGNOSIS — J4540 Moderate persistent asthma, uncomplicated: Secondary | ICD-10-CM | POA: Diagnosis not present

## 2022-05-17 DIAGNOSIS — J069 Acute upper respiratory infection, unspecified: Secondary | ICD-10-CM | POA: Diagnosis not present

## 2022-05-17 MED ORDER — PROMETHAZINE-DM 6.25-15 MG/5ML PO SYRP: 5.0000 mL | ORAL_SOLUTION | Freq: Four times a day (QID) | ORAL | 0 refills | Status: DC | PRN

## 2022-05-17 MED ORDER — PREDNISONE 50 MG PO TABS: ORAL_TABLET | ORAL | 0 refills | Status: DC

## 2022-05-17 NOTE — ED Triage Notes (Signed)
Pt reports headaches, coughing, mucus in throat and nose, sore throat x 3 days  Took municex and allergy med which gave some relief.

## 2022-05-17 NOTE — Discharge Instructions (Signed)
Take the nasal spray twice daily, continue your allergy and asthma regimen, DayQuil, NyQuil type medications, over-the-counter pain and fever reducers as needed and I have also sent over a good cough syrup.  If your symptoms are worsening or not improving over the next few days you may start the prednisone, particularly if your breathing gets worse.

## 2022-05-17 NOTE — ED Provider Notes (Signed)
[facility]    CSN: [id number] Arrival date & time: 05/17/22  [id number]      History   Chief Complaint Chief Complaint  Patient presents with   Cough    Entered by patient    HPI Sara Byrd is a 21 y.o. female.   Patient presenting today with 3-day history of sinus headache, hacking cough, nasal congestion, sore throat, postnasal drainage, sweats.  Denies fever, body aches, chest pain, shortness of breath, abdominal pain, nausea vomiting or diarrhea.  Taking her typical asthma and allergy regimen as well as Mucinex day and night with mild relief of symptoms.  Numerous sick contacts recently.     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      Home Medications    Prior to Admission medications   Medication Sig Start Date End Date Taking? Authorizing Provider  predniSONE (DELTASONE) 50 MG tablet Take 1 tab with breakfast daily for 3 days 05/17/22  Yes Volney American, PA-C  promethazine-dextromethorphan (PROMETHAZINE-DM) 6.25-15 MG/5ML syrup Take 5 mLs by mouth 4 (four) times daily as needed. 05/17/22  Yes Volney American, PA-C  acetaminophen (TYLENOL) 325 MG tablet Take 650 mg by mouth every 6 (six) hours as needed for mild pain or headache.    [provider]  albuterol (PROVENTIL) (2.5 MG/3ML) 0.083% nebulizer solution Take 3 mLs (2.5 mg total) by nebulization every 4 (four) hours. 04/24/15   Mesner, Corene Cornea, MD  azelastine (ASTELIN) 0.1 % nasal spray Place 2 sprays into both nostrils 2 (two) times daily. 01/26/22   Valentina Shaggy, MD  Biotin w/ Vitamins C & E (HAIR/SKIN/NAILS PO) Take by mouth.    [provider]  busPIRone (BUSPAR) 5 MG tablet Take 1 tablet twice a day by oral route as needed. 04/11/22   [provider]  cetirizine (ZYRTEC) 10 MG tablet Take 10 mg by mouth daily. Patient not taking: Reported on 04/21/2022    [provider]  Chlorpheniramine Maleate 12 MG TBCR Take 1 tablet (12 mg total) by mouth in the morning and at bedtime. 01/26/22   Valentina Shaggy, MD  desogestrel-ethinyl estradiol (APRI) 0.15-30 MG-MCG tablet Take 1 tablet by mouth daily. 02/04/22   Florian Buff, MD  diphenhydrAMINE (BENADRYL) 50 MG tablet Take 50 mg by mouth at bedtime as needed for itching. Patient not taking: Reported on 04/21/2022    [provider]  diphenhydrAMINE (SOMINEX) 25 MG tablet Take 25-50 mg by mouth daily. Patient not taking: Reported on 01/02/2020    [provider]  EPINEPHrine (EPIPEN 2-PAK) 0.3 mg/0.3 mL IJ SOAJ injection Inject 0.3 mg into the muscle as needed for anaphylaxis. 02/18/22   Valentina Shaggy, MD  levalbuterol [facility] HFA) 45 MCG/ACT inhaler Inhale 2 puffs into the lungs every 4 (four) hours as needed for wheezing. 04/29/22   Dara Hoyer, FNP  levocetirizine (XYZAL) 5 MG tablet Take 5 mg by mouth every evening. Patient not taking: Reported on 01/26/2022    [provider]  levothyroxine (SYNTHROID)  100 MCG tablet Take 100 mcg by mouth daily before breakfast. Patient not taking: Reported on 01/26/2022    [provider]  medroxyPROGESTERone (PROVERA) 10 MG tablet Take 1 tablet (10 mg total) by mouth daily. Patient not taking: Reported on 01/16/2020 01/02/20   Cresenzo-Dishmon, Scarlette Calico, CNM  mometasone (NASONEX) 50 MCG/ACT nasal spray Place 1 spray into the nose daily. Patient not taking: Reported on 04/21/2022 01/26/22   Alfonse Spruce, MD  montelukast (SINGULAIR) 10 MG tablet Take 1 tablet (10 mg total) by mouth  daily. 01/26/22   Alfonse Spruce, MD  olopatadine (PATADAY) 0.1 % ophthalmic solution Place 1 drop into both eyes 2 (two) times daily. Patient not taking: Reported on 01/26/2022 10/25/21   Leath-Warren, Sadie Haber, NP  RYVENT 6 MG TABS Take 1 tablet by mouth daily. Patient not taking: Reported on 04/21/2022 01/12/22   [provider]  sertraline (ZOLOFT) 25 MG tablet Take 1 tablet every day by oral route at bedtime. 04/11/22   [provider]  SYMBICORT 80-4.5 MCG/ACT inhaler Inhale 2 puffs into the lungs 2 (two) times daily. 04/29/22   Ambs, Norvel Richards, FNP    Family History Family History  Problem Relation Age of Onset   Diabetes Mother    Anxiety disorder Mother    Allergic rhinitis Father    Breast cancer Maternal Grandmother    Other Maternal Grandmother        hysterectomy   Kidney failure Maternal Grandmother     Social History Social History   Tobacco Use   Smoking status: Never    Passive exposure: Yes   Smokeless tobacco: Never  Substance Use Topics   Alcohol use: No   Drug use: No     Allergies   Molds & smuts and Shrimp extract allergy skin test   Review of Systems Review of Systems Per HPI  Physical Exam Triage Vital Signs ED Triage Vitals  Enc Vitals Group     BP 05/17/22 1020 (!) 152/92     Pulse Rate 05/17/22 1020 (!) 102     Resp 05/17/22 1020 20     Temp 05/17/22 1020 98.6 F (37 C)     Temp Source 05/17/22 1020 Oral     SpO2 05/17/22 1020 98 %     Weight --      Height --      Head Circumference --      Peak Flow --      Pain Score 05/17/22 1026 0     Pain Loc --      Pain Edu? --      Excl. in GC? --    No data found.  Updated Vital Signs BP (!) 152/92 (BP Location: Right Arm)   Pulse (!) 102   Temp 98.6 F (37 C) (Oral)   Resp 20   LMP 04/26/2022 (Exact Date)   SpO2 98%   Visual Acuity Right Eye Distance:   Left Eye Distance:   Bilateral Distance:    Right Eye Near:   Left Eye Near:    Bilateral Near:      Physical Exam Vitals and nursing note reviewed.  Constitutional:      Appearance: Normal appearance.  HENT:     Head: Atraumatic.     Right Ear: Tympanic membrane and external ear normal.     Left Ear: Tympanic membrane and external ear normal.     Nose: Rhinorrhea present.     Mouth/Throat:     Mouth:  Mucous membranes are moist.     Pharynx: Posterior oropharyngeal erythema present.  Eyes:     Extraocular Movements: Extraocular movements intact.     Conjunctiva/sclera: Conjunctivae normal.  Cardiovascular:     Rate and Rhythm: Normal rate and regular rhythm.     Heart sounds: Normal heart sounds.  Pulmonary:     Effort: Pulmonary effort is normal.     Breath sounds: Normal breath sounds. No wheezing or rales.  Musculoskeletal:        General: Normal range of motion.     Cervical back: Normal range of motion and neck supple.  Skin:    General: Skin is warm and dry.  Neurological:     Mental Status: She is alert and oriented to person, place, and time.  Psychiatric:        Mood and Affect: Mood normal.        Thought Content: Thought content normal.      UC Treatments / Results  Labs (all labs ordered are listed, but only abnormal results are displayed) Labs Reviewed - No data to display  EKG   Radiology No results found.  Procedures Procedures (including critical care time)  Medications Ordered in UC Medications - No data to display  Initial Impression / Assessment and Plan / UC Course  I have reviewed the triage vital signs and the nursing notes.  Pertinent labs & imaging results that were available during my care of the patient were reviewed by me and considered in my medical decision making (see chart for details).     Suspect viral respiratory infection, will forego flu testing given testing backorder and declines COVID testing today.  Treat symptoms with Phenergan DM, Flonase, over-the-counter cold congestion medications in addition to allergy and  asthma regimen.  Given her history of asthma prednisone sent in case breathing worsening at any time.  Work note given.  Return for worsening symptoms.  Final Clinical Impressions(s) / UC Diagnoses   Final diagnoses:  Viral URI with cough  Moderate persistent asthma without complication     Discharge Instructions      Take the nasal spray twice daily, continue your allergy and asthma regimen, DayQuil, NyQuil type medications, over-the-counter pain and fever reducers as needed and I have also sent over a good cough syrup.  If your symptoms are worsening or not improving over the next few days you may start the prednisone, particularly if your breathing gets worse.    ED Prescriptions     Medication Sig Dispense Auth. Provider   promethazine-dextromethorphan (PROMETHAZINE-DM) 6.25-15 MG/5ML syrup Take 5 mLs by mouth 4 (four) times daily as needed. 100 mL Volney American, PA-C   predniSONE (DELTASONE) 50 MG tablet Take 1 tab with breakfast daily for 3 days 3 tablet Volney American, [place]      PDMP not reviewed this encounter.   Volney American, [place] 05/17/22 1055

## 2022-05-20 ENCOUNTER — Ambulatory Visit
Admission: EM | Admit: 2022-05-20 | Discharge: 2022-05-20 | Disposition: A | Discharge status: Home / Self Care | Payer: Medicaid Other | Attending: Family Medicine | Admitting: Family Medicine

## 2022-05-20 ENCOUNTER — Telehealth: Payer: Self-pay | Admitting: Emergency Medicine

## 2022-05-20 DIAGNOSIS — J4541 Moderate persistent asthma with (acute) exacerbation: Secondary | ICD-10-CM

## 2022-05-20 DIAGNOSIS — J22 Unspecified acute lower respiratory infection: Secondary | ICD-10-CM

## 2022-05-20 HISTORY — DX: Anxiety disorder, unspecified: F41.9

## 2022-05-20 MED ORDER — AZITHROMYCIN 250 MG PO TABS: ORAL_TABLET | ORAL | 0 refills | Status: DC

## 2022-05-20 NOTE — Telephone Encounter (Signed)
Wants medications sent to [facility].

## 2022-05-20 NOTE — ED Triage Notes (Signed)
Pt reports stuffy nose with constant mucus. Took the prednisone the UC gave her Tuesday, but now feels as though she is wheezing. Took mucinex dm which gave slight relief. Still feels like mucus is in her chest. She says that its green flem coming up.

## 2022-05-20 NOTE — ED Provider Notes (Signed)
[facility]    CSN: [id number] Arrival date & time: 05/20/22  0831      History   Chief Complaint No chief complaint on file.   HPI Sara Byrd is a 21 y.o. female.   Presenting today with about a week of progressively worsening productive cough, chest tightness, chest congestion, continue nasal congestion.  Was diagnosed with viral upper respiratory infection about 3 days ago and given prednisone, cough syrup which she states may have helped her sinuses some but still feeling like things are stuck in her chest.  Has been using her allergy medication, Zicam, Mucinex DM, Symbicort, nebulizer treatments with no relief.  Denies fever, chest pain, severe shortness of breath.  History of asthma, seasonal allergies.    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      Home Medications    Prior to Admission medications   Medication Sig Start Date End Date Taking? Authorizing Provider  acetaminophen (TYLENOL) 325 MG tablet Take 650 mg by mouth every 6 (six) hours as needed for mild pain or headache.    [provider]  albuterol (PROVENTIL) (2.5 MG/3ML) 0.083% nebulizer solution Take 3 mLs (2.5 mg total) by nebulization every 4 (four) hours. 04/24/15   Mesner, Barbara Cower, MD  azelastine (ASTELIN) 0.1 % nasal spray Place 2 sprays into both nostrils 2 (two) times daily. 01/26/22   Alfonse Spruce, MD  azithromycin (ZITHROMAX) 250 MG tablet Take first 2 tablets together, then 1 every day until finished. 05/20/22   Particia Nearing, PA-C   Biotin w/ Vitamins C & E (HAIR/SKIN/NAILS PO) Take by mouth.    [provider]  busPIRone (BUSPAR) 5 MG tablet Take 1 tablet twice a day by oral route as needed. 04/11/22   [provider]  cetirizine (ZYRTEC) 10 MG tablet Take 10 mg by mouth daily. Patient not taking: Reported on 04/21/2022    [provider]  Chlorpheniramine Maleate 12 MG TBCR Take 1 tablet (12 mg total) by mouth in the morning and at bedtime. 01/26/22   Alfonse Spruce, MD  desogestrel-ethinyl estradiol (APRI) 0.15-30 MG-MCG tablet Take 1 tablet by mouth daily. 02/04/22   Lazaro Arms, MD  diphenhydrAMINE (BENADRYL) 50 MG tablet Take 50 mg by mouth at bedtime as needed for itching. Patient not taking: Reported on 04/21/2022    [provider]  diphenhydrAMINE (SOMINEX) 25 MG tablet Take 25-50 mg by mouth daily. Patient not taking: Reported on 01/02/2020    [provider]  EPINEPHrine (EPIPEN 2-PAK) 0.3 mg/0.3 mL IJ SOAJ injection Inject 0.3 mg into the muscle as needed for anaphylaxis. 02/18/22   Alfonse Spruce, MD  levalbuterol [facility] HFA) 45 MCG/ACT inhaler Inhale 2 puffs into the lungs every 4 (four) hours as needed for wheezing. 04/29/22   Hetty Blend, FNP  levocetirizine (XYZAL) 5 MG tablet Take 5 mg by mouth every evening. Patient not taking: Reported on 01/26/2022    [provider]  levothyroxine (SYNTHROID) 100 MCG tablet Take  100 mcg by mouth daily before breakfast. Patient not taking: Reported on 01/26/2022    [provider]  medroxyPROGESTERone (PROVERA) 10 MG tablet Take 1 tablet (10 mg total) by mouth daily. Patient not taking: Reported on 01/16/2020 01/02/20   Cresenzo-Dishmon, Scarlette Calico, CNM  mometasone (NASONEX) 50 MCG/ACT nasal spray Place 1 spray into the nose daily. Patient not taking: Reported on 04/21/2022 01/26/22   Alfonse Spruce, MD  montelukast (SINGULAIR) 10 MG tablet Take 1 tablet (10 mg total) by mouth daily. 01/26/22    Alfonse Spruce, MD  olopatadine (PATADAY) 0.1 % ophthalmic solution Place 1 drop into both eyes 2 (two) times daily. Patient not taking: Reported on 01/26/2022 10/25/21   Leath-Warren, Sadie Haber, NP  predniSONE (DELTASONE) 50 MG tablet Take 1 tab with breakfast daily for 3 days 05/17/22   Particia Nearing, PA-C  promethazine-dextromethorphan (PROMETHAZINE-DM) 6.25-15 MG/5ML syrup Take 5 mLs by mouth 4 (four) times daily as needed. 05/17/22   Particia Nearing, PA-C  RYVENT 6 MG TABS Take 1 tablet by mouth daily. Patient not taking: Reported on 04/21/2022 01/12/22   [provider]  sertraline (ZOLOFT) 25 MG tablet Take 1 tablet every day by oral route at bedtime. 04/11/22   [provider]  SYMBICORT 80-4.5 MCG/ACT inhaler Inhale 2 puffs into the lungs 2 (two) times daily. 04/29/22   Ambs, Norvel Richards, FNP    Family History Family History  Problem Relation Age of Onset   Diabetes Mother    Anxiety disorder Mother    Allergic rhinitis Father    Breast cancer Maternal Grandmother    Other Maternal Grandmother        hysterectomy   Kidney failure Maternal Grandmother     Social History Social History   Tobacco Use   Smoking status: Never    Passive exposure: Yes   Smokeless tobacco: Never  Substance Use Topics   Alcohol use: No   Drug use: No     Allergies   Molds & smuts and Shrimp extract allergy skin test   Review of Systems Review of Systems Per HPI  Physical Exam Triage Vital Signs ED Triage Vitals  Enc Vitals Group     BP 05/20/22 0941 (!) 142/87     Pulse Rate 05/20/22 0941 99     Resp 05/20/22 0941 20     Temp 05/20/22 0941 97.6 F (36.4 C)     Temp Source 05/20/22 0941 Oral     SpO2 05/20/22 0941 98 %     Weight --      Height --      Head Circumference --      Peak Flow --      Pain Score 05/20/22 0945 5     Pain Loc --      Pain Edu? --      Excl. in GC? --    No data found.  Updated Vital Signs BP (!) 142/87    Pulse 99   Temp 97.6 F (36.4 C) (Oral)   Resp 20   LMP 04/26/2022 (Exact Date)   SpO2 98%   Visual Acuity Right Eye Distance:   Left Eye Distance:   Bilateral Distance:    Right Eye Near:   Left Eye Near:    Bilateral Near:     Physical Exam Vitals and nursing note reviewed.  Constitutional:      Appearance: Normal appearance.  HENT:     Head: Atraumatic.     Right  Ear: Tympanic membrane and external ear normal.     Left Ear: Tympanic membrane and external ear normal.     Nose: Congestion present.     Mouth/Throat:     Mouth: Mucous membranes are moist.     Pharynx: Posterior oropharyngeal erythema present.  Eyes:     Extraocular Movements: Extraocular movements intact.     Conjunctiva/sclera: Conjunctivae normal.  Cardiovascular:     Rate and Rhythm: Normal rate and regular rhythm.     Heart sounds: Normal heart sounds.  Pulmonary:     Effort: Pulmonary effort is normal.     Breath sounds: Wheezing present.  Musculoskeletal:        General: Normal range of motion.     Cervical back: Normal range of motion and neck supple.  Skin:    General: Skin is warm and dry.  Neurological:     Mental Status: She is alert and oriented to person, place, and time.  Psychiatric:        Mood and Affect: Mood normal.        Thought Content: Thought content normal.      UC Treatments / Results  Labs (all labs ordered are listed, but only abnormal results are displayed) Labs Reviewed - No data to display  EKG   Radiology No results found.  Procedures Procedures (including critical care time)  Medications Ordered in UC Medications - No data to display  Initial Impression / Assessment and Plan / UC Course  I have reviewed the triage vital signs and the nursing notes.  Pertinent labs & imaging results that were available during my care of the patient were reviewed by me and considered in my medical decision making (see chart for details).     Vital signs overall  reassuring today, exam revealing moderate wheezes and chest congestion despite prednisone, inhalers, neb treatments.  Will start azithromycin to cover for possible developing lower respiratory infection in addition to continuing all the medication she is already taking.  Return for worsening symptoms. Final Clinical Impressions(s) / UC Diagnoses   Final diagnoses:  Lower respiratory infection  Moderate persistent asthma with acute exacerbation   Discharge Instructions   None    ED Prescriptions     Medication Sig Dispense Auth. Provider   azithromycin (ZITHROMAX) 250 MG tablet Take first 2 tablets together, then 1 every day until finished. 6 tablet Particia Nearing, [place]      PDMP not reviewed this encounter.   Particia Nearing, [place] 05/20/22 1003

## 2022-05-23 HISTORY — PX: WISDOM TOOTH EXTRACTION: SHX21

## 2022-06-17 ENCOUNTER — Ambulatory Visit (INDEPENDENT_AMBULATORY_CARE_PROVIDER_SITE_OTHER): Payer: Medicaid Other

## 2022-06-17 DIAGNOSIS — J309 Allergic rhinitis, unspecified: Secondary | ICD-10-CM

## 2022-08-05 ENCOUNTER — Ambulatory Visit (INDEPENDENT_AMBULATORY_CARE_PROVIDER_SITE_OTHER): Payer: Medicaid Other | Admitting: *Deleted

## 2022-08-05 DIAGNOSIS — J309 Allergic rhinitis, unspecified: Secondary | ICD-10-CM

## 2022-08-10 ENCOUNTER — Ambulatory Visit (INDEPENDENT_AMBULATORY_CARE_PROVIDER_SITE_OTHER): Payer: Medicaid Other

## 2022-08-10 DIAGNOSIS — J309 Allergic rhinitis, unspecified: Secondary | ICD-10-CM | POA: Diagnosis not present

## 2022-08-26 ENCOUNTER — Other Ambulatory Visit: Payer: Self-pay | Admitting: Allergy & Immunology

## 2022-09-07 ENCOUNTER — Ambulatory Visit (INDEPENDENT_AMBULATORY_CARE_PROVIDER_SITE_OTHER): Payer: Medicaid Other

## 2022-09-07 DIAGNOSIS — J309 Allergic rhinitis, unspecified: Secondary | ICD-10-CM | POA: Diagnosis not present

## 2022-09-07 NOTE — Progress Notes (Signed)
Immunotherapy   Patient Details  Name: Sara Byrd MRN: [id number] Date of Birth: 11-Dec-2000  09/07/2022  Sara Byrd here to pick up  vials for grasses, weeds, trees, cats, dogs, ragweed's, and molds.  Following schedule: A  Frequency:1 time per week Epi-Pen:Epi-Pen Available  Consent signed previously and patient instructions given as well as paperwork to give to Sara Byrd nurse. Patient was given Sara Byrd dose in office today and was sent home with Sara Byrd vials to take to [place]. Patient waited in office for thirty minutes without an issue.    Ralene Muskrat 09/07/2022, 12:44 PM

## 2022-10-27 ENCOUNTER — Ambulatory Visit (INDEPENDENT_AMBULATORY_CARE_PROVIDER_SITE_OTHER): Payer: Medicaid Other

## 2022-10-27 ENCOUNTER — Encounter: Payer: Self-pay | Admitting: Cardiology

## 2022-10-27 ENCOUNTER — Ambulatory Visit: Payer: Medicaid Other | Attending: Cardiology | Admitting: Cardiology

## 2022-10-27 ENCOUNTER — Other Ambulatory Visit: Payer: Self-pay | Admitting: Cardiology

## 2022-10-27 ENCOUNTER — Telehealth: Payer: Self-pay | Admitting: Cardiology

## 2022-10-27 VITALS — BP 100/66 | HR 92 | Ht 64.0 in | Wt 245.0 lb

## 2022-10-27 DIAGNOSIS — R002 Palpitations: Secondary | ICD-10-CM

## 2022-10-27 DIAGNOSIS — R000 Tachycardia, unspecified: Secondary | ICD-10-CM | POA: Diagnosis not present

## 2022-10-27 NOTE — Patient Instructions (Addendum)
Medication Instructions:  Your physician recommends that you continue on your current medications as directed. Please refer to the Current Medication list given to you today.  Labwork: none  Testing/Procedures: Your physician has recommended that you wear a Zio monitor.   This monitor is a medical device that records the heart's electrical activity. Doctors most often use these monitors to diagnose arrhythmias. Arrhythmias are problems with the speed or rhythm of the heartbeat. The monitor is a small device applied to your chest. You can wear one while you do your normal daily activities. While wearing this monitor if you have any symptoms to push the button and record what you felt. Once you have worn this monitor for the period of time provider prescribed (for 14 days), you will return the monitor device in the postage paid box. Once it is returned they will download the data collected and provide us with a report which the provider will then review and we will call you with those results. Important tips:  Avoid showering during the first 24 hours of wearing the monitor. Avoid excessive sweating to help maximize wear time. Do not submerge the device, no hot tubs, and no swimming pools. Keep any lotions or oils away from the patch. After 24 hours you may shower with the patch on. Take brief showers with your back facing the shower head.  Do not remove patch once it has been placed because that will interrupt data and decrease adhesive wear time. Push the button when you have any symptoms and write down what you were feeling. Once you have completed wearing your monitor, remove and place into box which has postage paid and place in your outgoing mailbox.  If for some reason you have misplaced your box then call our office and we can provide another box and/or mail it off for you.  Follow-Up: Your physician recommends that you schedule a follow-up appointment in: pending  Any Other Special  Instructions Will Be Listed Below (If Applicable).  If you need a refill on your cardiac medications before your next appointment, please call your pharmacy. 

## 2022-10-27 NOTE — Telephone Encounter (Signed)
14 Day ZIO XT dx: palpitations 

## 2022-10-27 NOTE — Progress Notes (Signed)
Clinical Summary Ms. Noxon is a 22 y.o.female  1.Tachycardia/Palpitaitons   - 02/2022 TSH normal, K 4.5, Hgb 12.8  - went to urgent care last week.  -at prior visit reported  at football game had walked up large hill, sat down and heart kept racing. Was also feeling anxious at that time. Some ongoign symptoms overnight.  - high caffeine that day. Grande coffee from [employer], tea, soda that day. - reports long history of anxiety, just started medications last week    -symptoms are doing better but nor esovled. Episodes roughly 1-2 times per month, however some days can have several episodes back to back.  Often comes on with her anxiety, but not always.  -still limiting caffeine.   2. 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  Past Medical History:  Diagnosis Date   Anxiety    Asthma    Eczema      Allergies  Allergen Reactions   Molds & Smuts Other (See Comments)    Asthma  Other reaction(s): Other (See Comments) Asthma    Shrimp Extract      Current Outpatient Medications  Medication Sig Dispense Refill   acetaminophen (TYLENOL) 325 MG tablet Take 650 mg by mouth every 6 (six) hours as needed for mild pain or headache.     albuterol (PROVENTIL) (2.5 MG/3ML) 0.083% nebulizer solution Take 3 mLs (2.5 mg total) by nebulization every 4 (four) hours. 75 mL 0   azelastine (ASTELIN) 0.1 % nasal spray Place 2 sprays into both nostrils 2 (two) times daily. 30 mL 5   azithromycin (ZITHROMAX) 250 MG tablet Take first 2 tablets together, then 1 every day until finished. 6 tablet 0   Biotin w/ Vitamins C & E (HAIR/SKIN/NAILS PO) Take by mouth.     busPIRone (BUSPAR) 5 MG tablet Take 1 tablet twice a day by oral route as needed.     cetirizine (ZYRTEC) 10 MG tablet Take 10 mg by mouth daily. (Patient not taking: Reported on 04/21/2022)     Chlorpheniramine Maleate 12 MG TBCR Take 1 tablet (12 mg total)  by mouth in the morning and at bedtime. 60 tablet 5   desogestrel-ethinyl estradiol (APRI) 0.15-30 MG-MCG tablet Take 1 tablet by mouth daily. 28 tablet 12   diphenhydrAMINE (BENADRYL) 50 MG tablet Take 50 mg by mouth at bedtime as needed for itching. (Patient not taking: Reported on 04/21/2022)     diphenhydrAMINE (SOMINEX) 25 MG tablet Take 25-50 mg by mouth daily. (Patient not taking: Reported on 01/02/2020)     EPINEPHrine (EPIPEN 2-PAK) 0.3 mg/0.3 mL IJ SOAJ injection Inject 0.3 mg into the muscle as needed for anaphylaxis. 2 each 1   levalbuterol (XOPENEX HFA) 45 MCG/ACT inhaler Inhale 2 puffs into the lungs every 4 (four) hours as needed for wheezing. 15 g 1   levocetirizine (XYZAL) 5 MG tablet Take 5 mg by mouth every evening. (Patient not taking: Reported on 01/26/2022)     levothyroxine (SYNTHROID) 100 MCG tablet Take 100 mcg by mouth daily before breakfast. (Patient not taking: Reported on 01/26/2022)     medroxyPROGESTERone (PROVERA) 10 MG tablet Take 1 tablet (10 mg total) by mouth daily. (Patient not taking: Reported on 01/16/2020) 10 tablet 0   mometasone (NASONEX) 50 MCG/ACT nasal spray Place 1 spray into the nose daily. (Patient not taking: Reported on 04/21/2022) 17 g 5   montelukast (SINGULAIR) 10  MG tablet Take 1 tablet by mouth daily. 30 tablet 0   olopatadine (PATADAY) 0.1 % ophthalmic solution Place 1 drop into both eyes 2 (two) times daily. (Patient not taking: Reported on 01/26/2022) 5 mL 12   predniSONE (DELTASONE) 50 MG tablet Take 1 tab with breakfast daily for 3 days 3 tablet 0   promethazine-dextromethorphan (PROMETHAZINE-DM) 6.25-15 MG/5ML syrup Take 5 mLs by mouth 4 (four) times daily as needed. 100 mL 0   RYVENT 6 MG TABS Take 1 tablet by mouth daily. (Patient not taking: Reported on 04/21/2022)     sertraline (ZOLOFT) 25 MG tablet Take 1 tablet every day by oral route at bedtime.     SYMBICORT 80-4.5 MCG/ACT inhaler Inhale 2 puffs into the lungs 2 (two) times daily. 10.2 g 5    No current facility-administered medications for this visit.     Past Surgical History:  Procedure Laterality Date   TYMPANOSTOMY TUBE PLACEMENT       Allergies  Allergen Reactions   Molds & Smuts Other (See Comments)    Asthma  Other reaction(s): Other (See Comments) Asthma    Shrimp Extract       Family History  Problem Relation Age of Onset   Diabetes Mother    Anxiety disorder Mother    Allergic rhinitis Father    Breast cancer Maternal Grandmother    Other Maternal Grandmother        hysterectomy   Kidney failure Maternal Grandmother      Social History Ms. Winegardner reports that she has never smoked. She has been exposed to tobacco smoke. She has never used smokeless tobacco. Ms. Notch reports no history of alcohol use.   Review of Systems CONSTITUTIONAL: No weight loss, fever, chills, weakness or fatigue.  HEENT: Eyes: No visual loss, blurred vision, double vision or yellow sclerae.No hearing loss, sneezing, congestion, runny nose or sore throat.  SKIN: No rash or itching.  CARDIOVASCULAR: per hpi RESPIRATORY: No shortness of breath, cough or sputum.  GASTROINTESTINAL: No anorexia, nausea, vomiting or diarrhea. No abdominal pain or blood.  GENITOURINARY: No burning on urination, no polyuria NEUROLOGICAL: No headache, dizziness, syncope, paralysis, ataxia, numbness or tingling in the extremities. No change in bowel or bladder control.  MUSCULOSKELETAL: No muscle, back pain, joint pain or stiffness.  LYMPHATICS: No enlarged nodes. No history of splenectomy.  PSYCHIATRIC: No history of depression or anxiety.  ENDOCRINOLOGIC: No reports of sweating, cold or heat intolerance. No polyuria or polydipsia.  Marland Kitchen   Physical Examination Today's Vitals   10/27/22 0812  BP: 100/66  Pulse: 92  SpO2: 99%  Weight: 245 lb (111.1 kg)  Height: 5\' 4"  (1.626 m)   Body mass index is 42.05 kg/m.  Gen: resting comfortably, no acute distress HEENT: no scleral  icterus, pupils equal round and reactive, no palptable cervical adenopathy,  CV: RRR, no m/rg, no jvd Resp: Clear to auscultation bilaterally GI: abdomen is soft, non-tender, non-distended, normal bowel sounds, no hepatosplenomegaly MSK: extremities are warm, no edema.  Skin: warm, no rash Neuro:  no focal deficits Psych: appropriate affect   Diagnostic Studies     Assessment and Plan  1.Tachycardia/palpitations - some ongoing symptoms, often related to anxiety but not always. Some days can have multiple episodes back to back. - plan for 14 day zio patch to further evaluate  F/u pending monitor results       Antoine Poche, M.D.

## 2022-10-27 NOTE — Addendum Note (Signed)
Addended by: Eustace Moore on: 10/27/2022 08:46 AM   Modules accepted: Orders

## 2022-10-31 ENCOUNTER — Encounter: Payer: Self-pay | Admitting: Cardiology

## 2022-11-02 ENCOUNTER — Ambulatory Visit: Payer: Medicaid Other | Admitting: Allergy & Immunology

## 2022-11-30 ENCOUNTER — Ambulatory Visit: Payer: Self-pay

## 2022-11-30 ENCOUNTER — Encounter: Payer: Self-pay | Admitting: Family Medicine

## 2022-11-30 ENCOUNTER — Ambulatory Visit (INDEPENDENT_AMBULATORY_CARE_PROVIDER_SITE_OTHER): Payer: Medicaid Other | Admitting: Family Medicine

## 2022-11-30 VITALS — BP 128/78 | HR 100 | Temp 98.3°F | Resp 16 | Ht 64.0 in | Wt 259.2 lb

## 2022-11-30 DIAGNOSIS — T7800XD Anaphylactic reaction due to unspecified food, subsequent encounter: Secondary | ICD-10-CM | POA: Diagnosis not present

## 2022-11-30 DIAGNOSIS — J4520 Mild intermittent asthma, uncomplicated: Secondary | ICD-10-CM | POA: Diagnosis not present

## 2022-11-30 DIAGNOSIS — J302 Other seasonal allergic rhinitis: Secondary | ICD-10-CM | POA: Diagnosis not present

## 2022-11-30 DIAGNOSIS — J3089 Other allergic rhinitis: Secondary | ICD-10-CM | POA: Diagnosis not present

## 2022-11-30 MED ORDER — MONTELUKAST SODIUM 10 MG PO TABS: 10.0000 mg | ORAL_TABLET | Freq: Every day | ORAL | 5 refills | Status: DC

## 2022-11-30 MED ORDER — RYALTRIS 665-25 MCG/ACT NA SUSP: 2.0000 | Freq: Two times a day (BID) | NASAL | 5 refills | Status: DC | PRN

## 2022-11-30 MED ORDER — CARBINOXAMINE MALEATE 4 MG PO TABS: ORAL_TABLET | ORAL | 5 refills | Status: DC

## 2022-11-30 NOTE — Patient Instructions (Addendum)
Asthma Continue montelukast 10 mg once a day to prevent cough or wheeze Continue levalbuterol 2 puffs once every 4 -6 hours as needed for cough or wheeze.  This will replace albuterol For asthma flare, begin Symbicort 80-2 puffs twice a day for 2 weeks or until cough and wheeze free  Allergic rhinitis Continue allergen avoidance measures directed toward pollen, cat, dog, and mold as listed below Continue allergen immunotherapy and have access to an epinephrine autoinjector set Begin carbinoxamine 4 mg tablets. Take 1-2 tablets once or twice a day for nasal symptoms Begin Ryaltris 2 sprays in each nostril twice a day as needed for nasal symptoms Consider saline nasal rinses as needed for nasal symptoms. Use this before any medicated nasal sprays for best result  Call the clinic if this treatment plan is not working well for you.  Follow up in 6 months or sooner if needed.  Reducing Pollen Exposure The American Academy of Allergy, Asthma and Immunology suggests the following steps to reduce your exposure to pollen during allergy seasons. Do not hang sheets or clothing out to dry; pollen may collect on these items. Do not mow lawns or spend time around freshly cut grass; mowing stirs up pollen. Keep windows closed at night.  Keep car windows closed while driving. Minimize morning activities outdoors, a time when pollen counts are usually at their highest. Stay indoors as much as possible when pollen counts or humidity is high and on windy days when pollen tends to remain in the air longer. Use air conditioning when possible.  Many air conditioners have filters that trap the pollen spores. Use a HEPA room air filter to remove pollen form the indoor air you breathe.  Control of Dog or Cat Allergen Avoidance is the best way to manage a dog or cat allergy. If you have a dog or cat and are allergic to dog or cats, consider removing the dog or cat from the home. If you have a dog or cat but don't  want to find it a new home, or if your family wants a pet even though someone in the household is allergic, here are some strategies that may help keep symptoms at bay:  Keep the pet out of your bedroom and restrict it to only a few rooms. Be advised that keeping the dog or cat in only one room will not limit the allergens to that room. Don't pet, hug or kiss the dog or cat; if you do, wash your hands with soap and water. High-efficiency particulate air (HEPA) cleaners run continuously in a bedroom or living room can reduce allergen levels over time. Regular use of a high-efficiency vacuum cleaner or a central vacuum can reduce allergen levels. Giving your dog or cat a bath at least once a week can reduce airborne allergen.  Control of Mold Allergen Mold and fungi can grow on a variety of surfaces provided certain temperature and moisture conditions exist.  Outdoor molds grow on plants, decaying vegetation and soil.  The major outdoor mold, Alternaria and Cladosporium, are found in very high numbers during hot and dry conditions.  Generally, a late Summer - Fall peak is seen for common outdoor fungal spores.  Rain will temporarily lower outdoor mold spore count, but counts rise rapidly when the rainy period ends.  The most important indoor molds are Aspergillus and Penicillium.  Dark, humid and poorly ventilated basements are ideal sites for mold growth.  The next most common sites of mold growth are the  bathroom and the kitchen.  Outdoor [employer] Use air conditioning and keep windows closed Avoid exposure to decaying vegetation. Avoid leaf raking. Avoid grain handling. Consider wearing a face mask if working in moldy areas.  Indoor Mold Control Maintain humidity below 50%. Clean washable surfaces with 5% bleach solution. Remove sources e.g. Contaminated carpets.

## 2022-11-30 NOTE — Progress Notes (Signed)
[address] [address] Dept: [phone]  FOLLOW UP NOTE  Patient ID: Sara Byrd, female    DOB: 2001/02/25  Age: 22 y.o. MRN: [id number] Date of Office Visit: 11/30/2022  Assessment  Chief Complaint: Asthma (Says she is good.) and Allergic Rhinitis  (Says they have gotten a lot better.)  HPI Sara Byrd is a 22 year old female who presents to the clinic for follow-up visit.  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  At today's visit, she reports her asthma has been moderately well-controlled with occasional shortness of breath.  She denies wheeze or cough with rest or activity.  She does report she has episodes of anxiety which also produce shortness of breath.  She continues montelukast 10 mg once a day, Symbicort only as needed for asthma flare, and rarely uses lev albuterol for relief of symptoms.  Allergic rhinitis is reported as moderately well-controlled with symptoms including nasal congestion, clear rhinorrhea, and sneezing.  She continues chlorphentermine once at nighttime with only mild relief of symptoms.  She is not currently using a nasal steroid, nasal antihistamine, or saline nasal rinses.  She continues on allergen immunotherapy directed toward grass pollen, weed pollen, ragweed pollen tree pollen, cat, dog, and mold.  She does report medium size reactions occurring shortly after the injection containing pollens and pets that last for about 1 day.  She reports these occur almost every week.  She denies cardiopulmonary or gastrointestinal symptoms with this local reaction.  She does report that she has pets at home that also sleep with her.  She is not avoiding any foods at this time.  She does not love shrimp but continues to eat some other shellfish.  Her current medications are listed in  the chart.   Drug Allergies:  Allergies  Allergen Reactions   Buspirone Other (See Comments)    Ear ringing, chest pain   Molds & Smuts Cough    Asthma flares up   Shrimp Extract Other (See Comments)    Dad is allergic so patient doesn't eat it.     Physical Exam: BP 128/78   Pulse 100   Temp 98.3 F (36.8 C) (Temporal)   Resp 16   Ht 5\' 4"  (1.626 m)   Wt 259 lb 3.2 oz (117.6 kg)   SpO2 99%   BMI 44.49 kg/m    Physical Exam Vitals reviewed.  Constitutional:      Appearance: Normal appearance.  HENT:     Head: Normocephalic and atraumatic.     Right Ear: Tympanic membrane normal.     Left Ear: Tympanic membrane normal.     Nose:     Comments: Bilateral nares edematous and pale with clear nasal drainage noted.  Pharynx normal.  Ears normal.  Eyes normal.    Mouth/Throat:     Pharynx: Oropharynx is clear.  Eyes:     Conjunctiva/sclera: Conjunctivae normal.  Cardiovascular:     Rate and Rhythm: Normal rate and regular rhythm.     Heart sounds: Normal heart sounds.  Pulmonary:     Effort: Pulmonary effort is normal.     Breath sounds: Normal breath sounds.     Comments: Lungs clear to auscultation Musculoskeletal:        General: Normal range of motion.     Cervical back: Normal range of motion and  neck supple.  Skin:    General: Skin is warm and dry.  Neurological:     Mental Status: She is alert and oriented to person, place, and time.  Psychiatric:        Mood and Affect: Mood normal.        Behavior: Behavior normal.        Thought Content: Thought content normal.        Judgment: Judgment normal.     Diagnostics: FVC 3.78 which is 99% of predicted value, FEV1 2.77 which is 83% of predicted value.  Spirometry indicates possible airway obstruction. On Friday yesterday  Assessment and Plan: 1. Mild intermittent asthma, uncomplicated   2. Seasonal and perennial allergic rhinitis   3. Anaphylactic shock due to food, subsequent encounter     Meds  ordered this encounter  Medications   Carbinoxamine Maleate 4 MG TABS    Sig: Take 1-2 tablets once or twice a day for nasal symptoms    Dispense:  30 tablet    Refill:  5   Olopatadine-Mometasone (RYALTRIS) 665-25 MCG/ACT SUSP    Sig: Place 2 sprays into the nose 2 (two) times daily as needed.    Dispense:  29 g    Refill:  5   montelukast (SINGULAIR) 10 MG tablet    Sig: Take 1 tablet (10 mg total) by mouth daily.    Dispense:  30 tablet    Refill:  5    Patient Instructions  Asthma Continue montelukast 10 mg once a day to prevent cough or wheeze Continue levalbuterol 2 puffs once every 4 -6 hours as needed for cough or wheeze.  This will replace albuterol For asthma flare, begin Symbicort 80-2 puffs twice a day for 2 weeks or until cough and wheeze free  Allergic rhinitis Continue allergen avoidance measures directed toward pollen, cat, dog, and mold as listed below Continue allergen immunotherapy and have access to an epinephrine autoinjector set Begin carbinoxamine 4 mg tablets. Take 1-2 tablets once or twice a day for nasal symptoms Begin Ryaltris 2 sprays in each nostril twice a day as needed for nasal symptoms Consider saline nasal rinses as needed for nasal symptoms. Use this before any medicated nasal sprays for best result  Call the clinic if this treatment plan is not working well for you.  Follow up in 6 months or sooner if needed.   Return in about 6 months (around 06/02/2023), or if symptoms worsen or fail to improve.    Thank you for the opportunity to care for this patient.  Please do not hesitate to contact me with questions.  Thermon Leyland, FNP [facility]

## 2022-12-01 ENCOUNTER — Encounter: Payer: Self-pay | Admitting: *Deleted

## 2022-12-06 ENCOUNTER — Telehealth: Payer: Self-pay

## 2022-12-06 ENCOUNTER — Other Ambulatory Visit (HOSPITAL_COMMUNITY): Payer: Self-pay

## 2022-12-06 NOTE — Telephone Encounter (Signed)
Pharmacy Patient Advocate Encounter  Received notification from  [facility]  that Prior Authorization for Ryaltris 665-25MCG/ACT suspension has been APPROVED from 12-06-2022 to 12-06-2023.Marland Kitchen  PA #/Case ID/Reference #: [id number] Key: [id number]  Copay is $0.00 per 30 day supply. Quantity 29grams per 30 days.

## 2022-12-06 NOTE — Telephone Encounter (Signed)
Pharmacy Patient Advocate Encounter   Received notification from CoverMyMeds that prior authorization for Ryaltris 665-25MCG/ACT suspension is required/requested.   Insurance verification completed.   The patient is insured through  [facility]  .   Per test claim: PA submitted to [employer] via CoverMyMeds Key/confirmation #/EOC  [id number] Status is pending Key: [id number]

## 2022-12-07 NOTE — Telephone Encounter (Addendum)
The patient called back and has been informed.

## 2022-12-07 NOTE — Telephone Encounter (Signed)
I called patient and left a message to call the office back to inform.

## 2023-02-13 ENCOUNTER — Encounter: Payer: Self-pay | Admitting: Nurse Practitioner

## 2023-02-13 ENCOUNTER — Ambulatory Visit: Payer: Medicaid Other | Attending: Nurse Practitioner | Admitting: Nurse Practitioner

## 2023-02-13 VITALS — BP 118/68 | HR 81

## 2023-02-13 DIAGNOSIS — F419 Anxiety disorder, unspecified: Secondary | ICD-10-CM

## 2023-02-13 DIAGNOSIS — R002 Palpitations: Secondary | ICD-10-CM

## 2023-02-13 NOTE — Patient Instructions (Addendum)

## 2023-02-13 NOTE — Progress Notes (Unsigned)
Cardiology Office Note:  .   Date:  02/13/2023 ID:  Rod Can, DOB 03-07-2001, MRN [id number] PCP: Benita Stabile, MD  [facility] Providers Cardiologist:  Dina Rich, MD    History of Present Illness: .   Sara Byrd is a very pleasant 22 y.o. female with a PMH of tachycardia, palpitations, anxiety, who presents today for 6 to 8-week follow-up.  Last seen by Dr. Dina Rich on October 27, 2022.  Patient noted some ongoing palpitations that were often related to her anxiety.  14-day ZIO monitor was arranged-see full report below.  Today she presents for 6 a week follow-up.  She states she still gets palpitations, however admits to caffeine consumption. Feels as though the medications she is on for anxiety are helping. Does get occasional atypical chest pain that says "feels like a panic attack," sees her PCP for her anxiety. Denies any shortness of breath, syncope, presyncope, dizziness, orthopnea, PND, swelling or significant weight changes, acute bleeding, or claudication.  ROS: Negative. See HPI.   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  Studies Reviewed: .    Cardiac monitor 10/2022:   14 day monitor   Rare supraventricular ectopy in the form of isolated PACs, couplets   Rare ventricular ectopy in the form of isolated PVCs   Reported symptoms correlated with normal sinus rhythm   No significant arrhythmias     Patch Wear Time:  13 days and 23 hours (2024-06-06T08:42:38-0400 to 2024-06-20T08:32:00-0400)   Patient had a min HR of 53 bpm, max HR of 168 bpm, and avg HR of 95 bpm. Predominant underlying rhythm was Sinus Rhythm. Isolated SVEs were rare (<1.0%), SVE Couplets were rare (<1.0%), and no SVE Triplets were present. Isolated VEs were rare (<1.0%),  and no VE Couplets or VE Triplets were present.      Physical Exam:   VS:  BP 118/68   Pulse 81   SpO2 99%    Wt Readings from Last 3 Encounters:  11/30/22 259 lb 3.2 oz  (117.6 kg)  10/27/22 245 lb (111.1 kg)  04/21/22 250 lb 6.4 oz (113.6 kg) (>99%, Z= 2.50)*   * Growth percentiles are based on CDC (Girls, 2-20 Years) data.    GEN: Obese, 22 y.o. female in no acute distress NECK: No JVD; No carotid bruits CARDIAC: S1/S2, RRR, no murmurs, rubs, gallops RESPIRATORY:  Clear to auscultation without rales, wheezing or rhonchi  EXTREMITIES:  No edema; No deformity   ASSESSMENT AND PLAN: .    Palpitations Admits to some palpitations but also admits to caffeine consumption and anxiety. Discussed weaning caffeine consumption and continue to follow-up with PCP regarding anxiety management - see below. Monitor overall reassuring. Heart healthy diet and regular cardiovascular exercise encouraged.   Anxiety Continue to follow-up with PCP for management. Support given.   Dispo: Follow-up with Dr. Dina Rich or APP in 1 year or sooner if anything changes.   Signed, Sharlene Dory, NP

## 2023-02-23 ENCOUNTER — Other Ambulatory Visit: Payer: Self-pay | Admitting: Obstetrics & Gynecology

## 2023-02-27 NOTE — Telephone Encounter (Signed)
Pt states she needs her birth control sent to [facility].

## 2023-06-02 ENCOUNTER — Ambulatory Visit: Payer: Medicaid Other | Admitting: Allergy & Immunology

## 2023-06-07 ENCOUNTER — Other Ambulatory Visit: Payer: Self-pay | Admitting: Family Medicine

## 2023-11-15 ENCOUNTER — Encounter (INDEPENDENT_AMBULATORY_CARE_PROVIDER_SITE_OTHER): Payer: Self-pay | Admitting: Otolaryngology

## 2023-11-15 ENCOUNTER — Ambulatory Visit (INDEPENDENT_AMBULATORY_CARE_PROVIDER_SITE_OTHER): Admitting: Otolaryngology

## 2023-11-15 VITALS — BP 127/89 | HR 108

## 2023-11-15 DIAGNOSIS — J3089 Other allergic rhinitis: Secondary | ICD-10-CM | POA: Diagnosis not present

## 2023-11-15 DIAGNOSIS — J342 Deviated nasal septum: Secondary | ICD-10-CM

## 2023-11-15 DIAGNOSIS — R0981 Nasal congestion: Secondary | ICD-10-CM

## 2023-11-15 DIAGNOSIS — J343 Hypertrophy of nasal turbinates: Secondary | ICD-10-CM | POA: Diagnosis not present

## 2023-11-15 DIAGNOSIS — J3489 Other specified disorders of nose and nasal sinuses: Secondary | ICD-10-CM | POA: Diagnosis not present

## 2023-11-15 DIAGNOSIS — R0982 Postnasal drip: Secondary | ICD-10-CM

## 2023-11-15 DIAGNOSIS — J339 Nasal polyp, unspecified: Secondary | ICD-10-CM

## 2023-11-15 NOTE — Progress Notes (Signed)
 ENT CONSULT:  Reason for Consult: concern for nasal polyp   HPI: Discussed the use of AI scribe software for clinical note transcription with the patient, who gave verbal consent to proceed.  History of Present Illness Sara Byrd is a 23 year old female with hx of environmental allergies and asthma who presents with chronic nasal congestion and suspected nasal polyps. She was referred by Dr. Fleeta Smock, her allergy  doctor, for evaluation of suspected nasal polyps.  She experiences chronic nasal congestion, often able to breathe through one nostril but not the other, which she finds confusing. She has had one sinus infection in the past year.  She has a history of allergies to dust and pollen, for which she is currently taking levocetirizine. She also uses montelukast  for her asthma, noting that it helps with her allergies as well. She has been receiving allergy  shots for about a year. She is on Byars.   Her past medical history includes frequent ear infections as a child, which required ear tubes x 1.   Records Reviewed:  Allergy  and Asthma office visit 04/29/22 Sara Byrd is a 23 year old female who presents to the clinic for follow-up visit.  She was last seen in this clinic on 01/26/2022 by Dr. 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 At today's visit, she reports that her symptoms of allergic rhintiis has been more well controlled since her last visit. She reports symptoms including clear rhinorrhea, nasal congestion, sneezing, and post nasal drainage. She continues montelukast  10 mg once a day, ChlorTab once or twice a day, and occasional azelastine . She is not currently using a saline nasal spray or steroid nasal spray. She continues allergy  injections directed toward pollens, mold, and pet with no large or local reactions. She continues to avoid shrimp, however, continues to  tolerate food cooked with shrimp with no adverse effects. Asthma is reported as moderately well controlled with shortness of breath as the main symptoms. She reports that she has been experiencing anxiety over the last several months and has experience symptoms including shortness of breath and tachycardia for which she uses beep breathing exercises for resolution. She started using Symbicort  once a day about 1-2 months ago without relief of symptoms. She visited Dr. Alvan, cardiology specialist, on 04/21/2022 at which time she had a normal EKG reading. Her current medications are listed in the chart.     Past Medical History:  Diagnosis Date   Anxiety    Asthma    Eczema     Past Surgical History:  Procedure Laterality Date   TYMPANOSTOMY TUBE PLACEMENT      Family History  Problem Relation Age of Onset   Diabetes Mother    Anxiety disorder Mother    Allergic rhinitis Father    Breast cancer Maternal Grandmother    Other Maternal Grandmother        hysterectomy   Kidney failure Maternal Grandmother     Social History:  reports that she has never smoked. She has been exposed to tobacco smoke. She has never used smokeless tobacco. She reports that she does not drink alcohol and does not use drugs.  Allergies:  Allergies  Allergen Reactions   Buspirone Other (See Comments)    Ear ringing, chest pain   Molds & Smuts Cough    Asthma flares up   Shrimp Extract Other (See Comments)    Dad is allergic so patient doesn't  eat it.     Medications: I have reviewed the patient's current medications.  The PMH, PSH, Medications, Allergies, and SH were reviewed and updated.  ROS: Constitutional: Negative for fever, weight loss and weight gain. Cardiovascular: Negative for chest pain and dyspnea on exertion. Respiratory: Is not experiencing shortness of breath at rest. Gastrointestinal: Negative for nausea and vomiting. Neurological: Negative for headaches. Psychiatric: The patient  is not nervous/anxious  Blood pressure 127/89, pulse (!) 108, SpO2 98%. There is no height or weight on file to calculate BMI.  PHYSICAL EXAM:  Exam: General: Well-developed, well-nourished Respiratory Respiratory effort: Equal inspiration and expiration without stridor Cardiovascular Peripheral Vascular: Warm extremities with equal color/perfusion Eyes: No nystagmus with equal extraocular motion bilaterally Neuro/Psych/Balance: Patient oriented to person, place, and time; Appropriate mood and affect; Gait is intact with no imbalance; Cranial nerves I-XII are intact Head and Face Inspection: Normocephalic and atraumatic without mass or lesion Palpation: Facial skeleton intact without bony stepoffs Salivary Glands: No mass or tenderness Facial Strength: Facial motility symmetric and full bilaterally ENT Pinna: External ear intact and fully developed External canal: Canal is patent with intact skin Tympanic Membrane: Clear and mobile External Nose: No scar or anatomic deformity Internal Nose: Septum is deviated to the left with narrowing of the nasal passage. One polyp noted in the right middle meatus, no purulence. Mucosal edema and erythema present.  Bilateral inferior turbinate hypertrophy.  Lips, Teeth, and gums: Mucosa and teeth intact and viable TMJ: No pain to palpation with full mobility Oral cavity/oropharynx: No erythema or exudate, no lesions present 3+ tonsils right one slightly more prominent than the left Nasopharynx: No mass or lesion with intact mucosa. Small residual adenoids Neck Neck and Trachea: Midline trachea without mass or lesion Thyroid : No mass or nodularity Lymphatics: No lymphadenopathy  Procedure:   PROCEDURE NOTE: nasal endoscopy  Preoperative diagnosis: chronic sinusitis symptoms  Postoperative diagnosis: same  Procedure: Diagnostic nasal endoscopy (68768)  Surgeon: Elena Larry, M.D.  Anesthesia: Topical lidocaine and Afrin  H&P  REVIEW: The patient's history and physical were reviewed today prior to procedure. All medications were reviewed and updated as well. Complications: None Condition is stable throughout exam Indications and consent: The patient presents with symptoms of chronic sinusitis not responding to previous therapies. All the risks, benefits, and potential complications were reviewed with the patient preoperatively and informed consent was obtained. The time out was completed with confirmation of the correct procedure.   Procedure: The patient was seated upright in the clinic. Topical lidocaine and Afrin were applied to the nasal cavity. After adequate anesthesia had occurred, the rigid nasal endoscope was passed into the nasal cavity. The nasal mucosa, turbinates, septum, and sinus drainage pathways were visualized bilaterally. This revealed no purulence or significant secretions that might be cultured. There was on polyp in the right middle meatus. The mucosa was intact and there was no crusting present. The scope was then slowly withdrawn and the patient tolerated the procedure well. There were no complications or blood loss.   Assessment/Plan: Encounter Diagnoses  Name Primary?   Environmental and seasonal allergies Yes   Chronic nasal congestion    Post-nasal drip    Hypertrophy of both inferior nasal turbinates    Nasal septal deviation    Nasal polyp     Assessment and Plan Assessment & Plan Chronic nasal congestion environmental allergies and nasal obstruction. Evidence of a small right nasal polyp noted in the right middle meatus. Left side visualization limited by swelling and  septal deviation. Surgery discussed if polyps grow or increase in number. She only had one sinus infection in the past 12 months, will hold off on CT sinuses.  - Consider nasal saline rinses - Continue montelukast  and levocetirizine and Xhane - Consult Dr. Fleeta Smock on adjuvant medications such as Dupixent or Xolair -  Monitor for increased congestion or sinus infections; consider imaging if symptoms worsen.  Allergic rhinitis Environmental Allergies Chronic allergic rhinitis managed with levocetirizine, montelukast , and allergy  shots. - Continue levocetirizine and Xhance - Continue allergy  shots.  Deviated nasal septum ITH Septal deviation causing nasal obstruction. No immediate intervention planned but if sx persist will consider septo/ITR    Thank you for allowing me to participate in the care of this patient. Please do not hesitate to contact me with any questions or concerns.   Elena Larry, MD [facility] Phone: [phone] Fax: [phone]    11/15/2023, 2:49 PM

## 2024-01-05 ENCOUNTER — Telehealth: Payer: Self-pay | Admitting: Cardiology

## 2024-01-05 ENCOUNTER — Encounter: Payer: Self-pay | Admitting: Cardiology

## 2024-01-05 NOTE — Telephone Encounter (Signed)
 Patient responded via mychart.

## 2024-01-05 NOTE — Telephone Encounter (Signed)
 Patient c/o Palpitations:  STAT if patient reporting lightheadedness, shortness of breath, or chest pain  How long have you had palpitations/irregular HR/ Afib? Are you having the symptoms now?   No  Are you currently experiencing lightheadedness, SOB or CP?  Random chest pains  Do you have a history of afib (atrial fibrillation) or irregular heart rhythm?   Yes  Have you checked your BP or HR? (document readings if available):   No  Are you experiencing any other symptoms?   No   Patient stated she has been having palpitations, random chest pains and acid reflux.  Patient noted she has asthma which reduces when she uses her inhaler.

## 2024-01-05 NOTE — Telephone Encounter (Signed)
 Left message for patient to call back and provide more information before sending to the provider.

## 2024-01-29 ENCOUNTER — Ambulatory Visit: Attending: Nurse Practitioner

## 2024-01-29 ENCOUNTER — Ambulatory Visit: Attending: Nurse Practitioner | Admitting: Nurse Practitioner

## 2024-01-29 ENCOUNTER — Encounter: Payer: Self-pay | Admitting: Nurse Practitioner

## 2024-01-29 ENCOUNTER — Other Ambulatory Visit: Payer: Self-pay | Admitting: Nurse Practitioner

## 2024-01-29 VITALS — BP 118/72 | HR 76 | Ht 64.0 in | Wt 301.0 lb

## 2024-01-29 DIAGNOSIS — Z13220 Encounter for screening for lipoid disorders: Secondary | ICD-10-CM

## 2024-01-29 DIAGNOSIS — F419 Anxiety disorder, unspecified: Secondary | ICD-10-CM

## 2024-01-29 DIAGNOSIS — R202 Paresthesia of skin: Secondary | ICD-10-CM

## 2024-01-29 DIAGNOSIS — R42 Dizziness and giddiness: Secondary | ICD-10-CM

## 2024-01-29 DIAGNOSIS — R6889 Other general symptoms and signs: Secondary | ICD-10-CM

## 2024-01-29 DIAGNOSIS — R002 Palpitations: Secondary | ICD-10-CM | POA: Diagnosis present

## 2024-01-29 DIAGNOSIS — R0789 Other chest pain: Secondary | ICD-10-CM

## 2024-01-29 NOTE — Progress Notes (Addendum)
 Cardiology Office Note:  .   Date:  01/29/2024 ID:  Sara Byrd, DOB 2001/02/01, MRN [id number] PCP: Leonce Lucie JINNY DEVONNA  [facility] Providers Cardiologist:  Alvan Carrier, MD    History of Present Illness: .   Sara Byrd is a very pleasant 23 y.o. female with a PMH of tachycardia, palpitations, anxiety, who presents today for scheduled follow-up.  Last seen by Dr. Carrier Alvan on October 27, 2022.  Patient noted some ongoing palpitations that were often related to her anxiety.  14-day ZIO monitor was arranged-see full report below.  02/13/2023 - Today she presents for 6 a week follow-up.  She states she still gets palpitations, however admits to caffeine consumption. Feels as though the medications she is on for anxiety are helping. Does get occasional atypical chest pain that says feels like a panic attack, sees her PCP for her anxiety. Denies any shortness of breath, syncope, presyncope, dizziness, orthopnea, PND, swelling or significant weight changes, acute bleeding, or claudication.  01/29/2024 -she is here for follow-up.  Still continues to note some infrequent palpitations, noticed with taking deep breaths, says she will notice that it flutters out.  Notices some weird chest pains noticed along the left side and along her back.  Does wonder if this is due to some acid reflux, has taken omeprazole over-the-counter that seems to help.  Difficult for her to describe these episodes.  No family history of CVD to her knowledge.  She is scheduled to see OB/GYN next month.  Does notice tingling along her right extremity/arm and as well as chest and face, notices feeling cold at times.  Thyroid  disease does run in her family.  Also tells me she has a family history of high cholesterol.  ROS: Negative. See HPI.   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  Studies Reviewed: SABRA    EKG:  EKG Interpretation Date/Time:  Monday January 29 2024 13:36:01 EDT Ventricular Rate:  85 PR Interval:  130 QRS Duration:  88 QT Interval:  378 QTC Calculation: 449 R Axis:   65  Text Interpretation: Normal sinus rhythm Normal ECG No previous ECGs available Confirmed by Miriam Norris [phone]) on 01/29/2024 1:49:22 PM     Cardiac monitor 10/2022:   14 day monitor   Rare supraventricular ectopy in the form of isolated PACs, couplets   Rare ventricular ectopy in the form of isolated PVCs   Reported symptoms correlated with normal sinus rhythm   No significant arrhythmias     Patch Wear Time:  13 days and 23 hours (2024-06-06T08:42:38-0400 to 2024-06-20T08:32:00-0400)   Patient had a min HR of 53 bpm, max HR of 168 bpm, and avg HR of 95 bpm. Predominant underlying rhythm was Sinus Rhythm. Isolated SVEs were rare (<1.0%), SVE Couplets were rare (<1.0%), and no SVE Triplets were present. Isolated VEs were rare (<1.0%),  and no VE Couplets or VE Triplets were present.      Physical Exam:   VS:  BP 118/72   Pulse 76   Ht 5' 4 (1.626 m)   Wt (!) 301 lb (136.5 kg)   SpO2 97%   BMI 51.67 kg/m    Wt Readings from Last 3 Encounters:  01/29/24 (!) 301 lb (136.5 kg)  11/30/22 259 lb 3.2 oz (117.6 kg)  10/27/22 245 lb (111.1 kg)    GEN: Obese, 23 y.o. female in no acute distress NECK: No JVD; No carotid bruits CARDIAC: S1/S2, RRR,  no murmurs, rubs, gallops RESPIRATORY:  Clear to auscultation without rales, wheezing or rhonchi  EXTREMITIES:  No edema; No deformity   ASSESSMENT AND PLAN: .    Atypical chest pains Does not sound cardiac related, atypical in etiology. No indication for ischemic evaluation at this time. Will continue to monitor. Heart healthy diet and regular cardiovascular exercise encouraged. Continue to follow with PCP.  Palpitations, tingling, cold sensation Admits to some palpitations. Will obtain Zio monitor. Heart healthy diet and regular cardiovascular exercise encouraged. Will obtain CBC, CMET, Mag, and thyroid   panel.   Anxiety Continue to follow-up with PCP for management. Support given.   4. Screening for HLD Does have a FH. Will obtain FLP.  Dispo: Follow-up with Dr. Dorn Ross or APP in 6-8 weeks or sooner if anything changes.   Signed, Almarie Crate, NP

## 2024-01-29 NOTE — Patient Instructions (Addendum)
 Medication Instructions:  Your physician recommends that you continue on your current medications as directed. Please refer to the Current Medication list given to you today.  Labwork: In 1-2 weeks with [facility]   Testing/Procedures: ZIO- Long Term Monitor Instructions   Your physician has requested you wear your ZIO patch monitor 14 days.   This is a single patch monitor.  [employer] supplies one patch monitor per enrollment.  Additional stickers are not available.   Please do not apply patch if you will be having a Nuclear Stress Test, Echocardiogram, Cardiac CT, MRI, or Chest Xray during the time frame you would be wearing the monitor. The patch cannot be worn during these tests.  You cannot remove and re-apply the ZIO XT patch monitor.   Your ZIO patch monitor will be sent USPS Priority mail from [facility] directly to your home address. The monitor may also be mailed to a PO BOX if home delivery is not available.   It may take 3-5 days to receive your monitor after you have been enrolled.   Once you have received you monitor, please review enclosed instructions.  Your monitor has already been registered assigning a specific monitor serial # to you.   Applying the monitor   Shave hair from upper left chest.   Hold abrader disc by orange tab.  Rub abrader in 40 strokes over left upper chest as indicated in your monitor instructions.   Clean area with 4 enclosed alcohol pads .  Use all pads to assure are is cleaned thoroughly.  Let dry.   Apply patch as indicated in monitor instructions.  Patch will be place under collarbone on left side of chest with arrow pointing upward.   Rub patch adhesive wings for 2 minutes.Remove white label marked 1.  Remove white label marked 2.  Rub patch adhesive wings for 2 additional minutes.   While looking in a mirror, press and release button in center of patch.  A small green light will flash 3-4 times .  This will be your only indicator  the monitor has been turned on.     Do not shower for the first 24 hours.  You may shower after the first 24 hours.   Press button if you feel a symptom. You will hear a small click.  Record Date, Time and Symptom in the Patient Log Book.   When you are ready to remove patch, follow instructions on last 2 pages of Patient Log Book.  Stick patch monitor onto last page of Patient Log Book.   Place Patient Log Book in [place] box.  Use locking tab on box and tape box closed securely.  The [employer] has [employer] on it.  Please place in mailbox as soon as possible.  Your physician should have your test results approximately 7 days after the monitor has been mailed back to [facility].   Call [facility] Customer Care at [phone] if you have questions regarding your ZIO XT patch monitor.  Call them immediately if you see an orange light blinking on your monitor.   If your monitor falls off in less than 4 days contact our Monitor department at [phone].  If your monitor becomes loose or falls off after 4 days call [employer] at [phone] for suggestions on securing your monitor.  Follow-Up: Your physician recommends that you schedule a follow-up appointment in: 6-8 weeks   Any Other Special Instructions Will Be Listed Below (If Applicable).  If you  need a refill on your cardiac medications before your next appointment, please call your pharmacy.

## 2024-02-01 ENCOUNTER — Ambulatory Visit: Payer: Self-pay | Admitting: Nurse Practitioner

## 2024-02-01 LAB — LIPID PANEL
Chol/HDL Ratio: 2.1 ratio (ref 0.0–4.4)
Cholesterol, Total: 171 mg/dL (ref 100–199)
HDL: 81 mg/dL (ref >39)
LDL Chol Calc (NIH): 74 mg/dL (ref 0–99)
Triglycerides: 88 mg/dL (ref 0–149)
VLDL Cholesterol Cal: 16 mg/dL (ref 5–40)

## 2024-02-01 LAB — MAGNESIUM: Magnesium: 2.1 mg/dL (ref 1.6–2.3)

## 2024-02-01 LAB — CBC
Hematocrit: 38.5 % (ref 34.0–46.6)
Hemoglobin: 12.1 g/dL (ref 11.1–15.9)
MCH: 26.3 pg — ABNORMAL LOW (ref 26.6–33.0)
MCHC: 31.4 g/dL — ABNORMAL LOW (ref 31.5–35.7)
MCV: 84 fL (ref 79–97)
Platelets: 350 x10E3/uL (ref 150–450)
RBC: 4.6 x10E6/uL (ref 3.77–5.28)
RDW: 13 % (ref 11.7–15.4)
WBC: 10.7 x10E3/uL (ref 3.4–10.8)

## 2024-02-01 LAB — COMPREHENSIVE METABOLIC PANEL WITH GFR
ALT: 6 IU/L (ref 0–32)
AST: 8 IU/L (ref 0–40)
Albumin: 3.7 g/dL — ABNORMAL LOW (ref 4.0–5.0)
Alkaline Phosphatase: 101 IU/L (ref 44–121)
BUN/Creatinine Ratio: 16 (ref 9–23)
BUN: 10 mg/dL (ref 6–20)
Bilirubin Total: 0.3 mg/dL (ref 0.0–1.2)
CO2: 19 mmol/L — ABNORMAL LOW (ref 20–29)
Calcium: 8.9 mg/dL (ref 8.7–10.2)
Chloride: 106 mmol/L (ref 96–106)
Creatinine, Ser: 0.62 mg/dL (ref 0.57–1.00)
Globulin, Total: 2.7 g/dL (ref 1.5–4.5)
Glucose: 77 mg/dL (ref 70–99)
Potassium: 4.7 mmol/L (ref 3.5–5.2)
Sodium: 138 mmol/L (ref 134–144)
Total Protein: 6.4 g/dL (ref 6.0–8.5)
eGFR: 130 mL/min/1.73 (ref >59)

## 2024-02-01 LAB — TSH+FREE T4
Free T4: 0.91 ng/dL (ref 0.82–1.77)
TSH: 3 u[IU]/mL (ref 0.450–4.500)

## 2024-02-19 DIAGNOSIS — R42 Dizziness and giddiness: Secondary | ICD-10-CM | POA: Diagnosis not present

## 2024-02-26 ENCOUNTER — Ambulatory Visit: Payer: Self-pay | Admitting: Nurse Practitioner

## 2024-03-04 ENCOUNTER — Ambulatory Visit: Admitting: Nurse Practitioner

## 2024-03-13 ENCOUNTER — Other Ambulatory Visit: Payer: Self-pay | Admitting: Obstetrics & Gynecology

## 2024-03-19 ENCOUNTER — Encounter: Payer: Self-pay | Admitting: Adult Health

## 2024-03-19 ENCOUNTER — Other Ambulatory Visit (HOSPITAL_COMMUNITY)
Admission: RE | Admit: 2024-03-19 | Discharge: 2024-03-19 | Disposition: A | Discharge status: Home / Self Care | Source: Ambulatory Visit | Attending: Obstetrics & Gynecology | Admitting: Obstetrics & Gynecology

## 2024-03-19 ENCOUNTER — Ambulatory Visit (INDEPENDENT_AMBULATORY_CARE_PROVIDER_SITE_OTHER): Admitting: Adult Health

## 2024-03-19 VITALS — BP 123/80 | HR 101 | Ht 64.0 in | Wt 300.5 lb

## 2024-03-19 DIAGNOSIS — Z01419 Encounter for gynecological examination (general) (routine) without abnormal findings: Secondary | ICD-10-CM | POA: Diagnosis not present

## 2024-03-19 DIAGNOSIS — F419 Anxiety disorder, unspecified: Secondary | ICD-10-CM | POA: Diagnosis not present

## 2024-03-19 DIAGNOSIS — Z0000 Encounter for general adult medical examination without abnormal findings: Secondary | ICD-10-CM | POA: Insufficient documentation

## 2024-03-19 DIAGNOSIS — F32A Depression, unspecified: Secondary | ICD-10-CM | POA: Diagnosis not present

## 2024-03-19 DIAGNOSIS — Z3041 Encounter for surveillance of contraceptive pills: Secondary | ICD-10-CM

## 2024-03-19 MED ORDER — SERTRALINE HCL 50 MG PO TABS: 50.0000 mg | ORAL_TABLET | Freq: Every day | ORAL | 0 refills | Status: AC

## 2024-03-19 MED ORDER — APRI 0.15-30 MG-MCG PO TABS: 1.0000 | ORAL_TABLET | Freq: Every day | ORAL | 12 refills | Status: AC

## 2024-03-19 NOTE — Progress Notes (Signed)
 Patient ID: Sara Byrd, female   DOB: 03-21-2001, 23 y.o.   MRN: [id number] History of Present Illness: Sara Byrd is a 23 year old white female,single, G0P0, in for a well woman gyn exam and first pap. She needs refill on Apri  and enough zoloft til sees new PCP Monday.  PCP is Dr Dartha   Current Medications, Allergies, Past Medical History, Past Surgical History, Family History and Social History were reviewed in [employer] electronic medical record.     Review of Systems: Patient denies any daily headaches(does have occasionally), hearing loss, fatigue, blurred vision, shortness of breath,  abdominal pain, problems with bowel movements, urination, or intercourse. No joint pain or mood swings.  Has had chest pain and saw cardiologist and thought to be anxiety related.  Feels angry and irritable at times.  She is happy with Apri .   Physical Exam:BP 123/80 (BP Location: Left Arm, Patient Position: Sitting, Cuff Size: Large)   Pulse (!) 101   Ht 5' 4 (1.626 m)   Wt (!) 300 lb 8 oz (136.3 kg)   LMP 03/13/2024 (Approximate)   BMI 51.58 kg/m   General:  Well developed, well nourished, no acute distress Skin:  Warm and dry Neck:  Midline trachea, normal thyroid , good ROM, no lymphadenopathy Lungs; Clear to auscultation bilaterally Breast:  No dominant palpable mass, retraction, or nipple discharge Cardiovascular: Regular rate and rhythm Abdomen:  Soft, non tender, no hepatosplenomegaly Pelvic:  External genitalia is normal in appearance, no lesions.  The vagina is normal in appearance. Urethra has no lesions or masses. The cervix is nulliparous, pap with GC/CHL performed.SABRA  Uterus is felt to be normal size, shape, and contour.  No adnexal masses or tenderness noted.Bladder is non tender, no masses felt. Extremities/musculoskeletal:  No swelling or varicosities noted, no clubbing or cyanosis Psych:  No mood changes, alert and cooperative,seems happy AA is 1 Fall risk is low     03/19/2024    3:35 PM  Depression screen PHQ 2/9  Decreased Interest 1  Down, Depressed, Hopeless 0  PHQ - 2 Score 1  Altered sleeping 2  Tired, decreased energy 2  Change in appetite 1  Feeling bad or failure about yourself  0  Trouble concentrating 1  Moving slowly or fidgety/restless 1  Suicidal thoughts 0  PHQ-9 Score 8       03/19/2024    3:35 PM  GAD 7 : Generalized Anxiety Score  Nervous, Anxious, on Edge 3  Control/stop worrying 2  Worry too much - different things 2  Trouble relaxing 1  Restless 0  Easily annoyed or irritable 3  Afraid - awful might happen 1  Total GAD 7 Score 12    Upstream - 03/19/24 1542       Pregnancy Intention Screening   Does the patient want to become pregnant in the next year? No    Does the patient's partner want to become pregnant in the next year? No    Would the patient like to discuss contraceptive options today? No      Contraception Wrap Up   Current Method Oral Contraceptive    End Method Oral Contraceptive    Contraception Counseling Provided Yes           Examination chaperoned by Clarita Salt LPN   Impression and plan: 1. Routine general medical examination at a health care facility (Primary) Pap sent Pap in 3 years if negative  Physical in 1 year  - Cytology - PAP( [facility])  2. Anxiety and depression Refilled zoloft 50 mg 1 daily Meds ordered this encounter  Medications   sertraline (ZOLOFT) 50 MG tablet    Sig: Take 1 tablet (50 mg total) by mouth daily.    Dispense:  30 tablet    Refill:  0    Supervising Provider:   JAYNE MINDER H [[id number]]   desogestrel -ethinyl estradiol  (APRI ) 0.15-30 MG-MCG tablet    Sig: Take 1 tablet by mouth daily.    Dispense:  28 tablet    Refill:  12    This prescription was filled on 02/19/2024. Any refills authorized will be placed on file.    Supervising Provider:   JAYNE MINDER DEL [[id number]]   Follow up with PCP and talk about anxiety more with her  3. Encounter for  surveillance of contraceptive pills Happy with Apri , will refill

## 2024-03-22 ENCOUNTER — Ambulatory Visit: Payer: Self-pay | Admitting: Adult Health

## 2024-03-22 LAB — CYTOLOGY - PAP
Adequacy: ABSENT
Chlamydia: NEGATIVE
Comment: NEGATIVE
Comment: NORMAL
Diagnosis: NEGATIVE
Neisseria Gonorrhea: NEGATIVE

## 2024-03-29 ENCOUNTER — Ambulatory Visit: Admitting: Nurse Practitioner

## 2024-03-29 NOTE — Progress Notes (Deleted)
 Cardiology Office Note:  .   Date:  01/29/2024 ID:  Sara Byrd, DOB [birth year], MRN [id number] PCP: Dartha Geralds, DO  [facility] Providers Cardiologist:  Alvan Carrier, MD    History of Present Illness: .   Sara Byrd is a very pleasant 23 y.o. female with a PMH of tachycardia, palpitations, anxiety, who presents today for scheduled follow-up.  Last seen by Dr. Carrier Alvan on October 27, 2022.  Patient noted some ongoing palpitations that were often related to her anxiety.  14-day ZIO monitor was arranged-see full report below.  02/13/2023 - Today she presents for 6 a week follow-up.  She states she still gets palpitations, however admits to caffeine consumption. Feels as though the medications she is on for anxiety are helping. Does get occasional atypical chest pain that says feels like a panic attack, sees her PCP for her anxiety. Denies any shortness of breath, syncope, presyncope, dizziness, orthopnea, PND, swelling or significant weight changes, acute bleeding, or claudication.  01/29/2024 -she is here for follow-up.  Still continues to note some infrequent palpitations, noticed with taking deep breaths, says she will notice that it flutters out.  Notices some weird chest pains noticed along the left side and along her back.  Does wonder if this is due to some acid reflux, has taken omeprazole over-the-counter that seems to help.  Difficult for her to describe these episodes.  No family history of CVD to her knowledge.  She is scheduled to see OB/GYN next month.  Does notice tingling along her right extremity/arm and as well as chest and face, notices feeling cold at times.  Thyroid  disease does run in her family.  Also tells me she has a family history of high cholesterol.  ROS: Negative. See HPI.   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  Studies Reviewed: SABRA    EKG:        Cardiac monitor 10/2022:   14 day monitor   Rare  supraventricular ectopy in the form of isolated PACs, couplets   Rare ventricular ectopy in the form of isolated PVCs   Reported symptoms correlated with normal sinus rhythm   No significant arrhythmias     Patch Wear Time:  13 days and 23 hours (2024-06-06T08:42:38-0400 to 2024-06-20T08:32:00-0400)   Patient had a min HR of 53 bpm, max HR of 168 bpm, and avg HR of 95 bpm. Predominant underlying rhythm was Sinus Rhythm. Isolated SVEs were rare (<1.0%), SVE Couplets were rare (<1.0%), and no SVE Triplets were present. Isolated VEs were rare (<1.0%),  and no VE Couplets or VE Triplets were present.      Physical Exam:   VS:  LMP 03/13/2024 (Approximate)    Wt Readings from Last 3 Encounters:  03/19/24 (!) 300 lb 8 oz (136.3 kg)  01/29/24 (!) 301 lb (136.5 kg)  11/30/22 259 lb 3.2 oz (117.6 kg)    GEN: Obese, 23 y.o. female in no acute distress NECK: No JVD; No carotid bruits CARDIAC: S1/S2, RRR, no murmurs, rubs, gallops RESPIRATORY:  Clear to auscultation without rales, wheezing or rhonchi  EXTREMITIES:  No edema; No deformity   ASSESSMENT AND PLAN: .    Atypical chest pains Does not sound cardiac related, atypical in etiology. No indication for ischemic evaluation at this time. Will continue to monitor. Heart healthy diet and regular cardiovascular exercise encouraged. Continue to follow with PCP.  Palpitations, tingling, cold sensation Admits to some palpitations. Will obtain Zio  monitor. Heart healthy diet and regular cardiovascular exercise encouraged. Will obtain CBC, CMET, Mag, and thyroid  panel.   Anxiety Continue to follow-up with PCP for management. Support given.   4. Screening for HLD Does have a FH. Will obtain FLP.  Dispo: Follow-up with Dr. Dorn Ross or APP in 6-8 weeks or sooner if anything changes.   Signed, Almarie Crate, NP

## 2024-06-06 ENCOUNTER — Ambulatory Visit: Attending: Cardiology | Admitting: Cardiology

## 2024-06-06 ENCOUNTER — Encounter: Payer: Self-pay | Admitting: Cardiology

## 2024-06-06 VITALS — BP 124/72 | HR 94 | Ht 64.0 in | Wt 310.8 lb

## 2024-06-06 DIAGNOSIS — R0789 Other chest pain: Secondary | ICD-10-CM | POA: Insufficient documentation

## 2024-06-06 DIAGNOSIS — R002 Palpitations: Secondary | ICD-10-CM | POA: Insufficient documentation

## 2024-06-06 NOTE — Progress Notes (Signed)
 "     Clinical Summary Ms. Grandpre is a 24 y.o.female seen today for follow up of the following medical problems.   1.Tachycardia/Palpitaitons   - 02/2022 TSH normal, K 4.5, Hgb 12.8  - went to urgent care last week.  -at prior visit reported  at football game had walked up large hill, sat down and heart kept racing. Was also feeling anxious at that time. Some ongoign symptoms overnight.  - high caffeine that day. Grande coffee from [employer], tea, soda that day. - reports long history of anxiety, just started medications last week    -symptoms are doing better but nor esovled. Episodes roughly 1-2 times per month, however some days can have several episodes back to back.  Often comes on with her anxiety, but not always.  -still limiting caffeine.   01/2024 6 day monitor; Rare ectopy, symptoms correlated with SR and mild sinus tach  - no recent heart fluttering - feels like anxiety has been well controlled. Takes zoloft  daily, hydroxyzine takes prn, needs  -still high caffeine intake    2. Chest pain occaisonal chest pains at times - can be right sided or left sided. Sharp pain, 3-4/10 in severity. Last few minutes. Can occur at rest or with activity. No other associated symptoms. Better with movement or position, better with deep breathing.     3. 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     Past Medical History:  Diagnosis Date   Anxiety    Asthma    Eczema      Allergies[1]   Current Outpatient Medications  Medication Sig Dispense Refill   albuterol  (PROVENTIL ) (2.5 MG/3ML) 0.083% nebulizer solution Take 3 mLs (2.5 mg total) by nebulization every 4 (four) hours. 75 mL 0   desogestrel -ethinyl estradiol  (APRI ) 0.15-30 MG-MCG tablet Take 1 tablet by mouth daily. 28 tablet 12   DUPIXENT 300 MG/2ML prefilled syringe      EPINEPHrine  (EPIPEN  2-PAK)  0.3 mg/0.3 mL IJ SOAJ injection Inject 0.3 mg into the muscle as needed for anaphylaxis. 2 each 1   hydrOXYzine (ATARAX) 25 MG tablet Take 25 mg by mouth 3 (three) times daily.     levocetirizine (XYZAL) 5 MG tablet SMARTSIG:1 Tablet(s) By Mouth Every Evening     montelukast  (SINGULAIR ) 10 MG tablet TAKE ONE TABLET BY MOUTH ONCE DAILY. 30 tablet 0   sertraline  (ZOLOFT ) 50 MG tablet Take 1 tablet (50 mg total) by mouth daily. 30 tablet 0   SYMBICORT  80-4.5 MCG/ACT inhaler Inhale 2 puffs into the lungs 2 (two) times daily. 10.2 g 5   No current facility-administered medications for this visit.     Past Surgical History:  Procedure Laterality Date   TYMPANOSTOMY TUBE PLACEMENT     WISDOM TOOTH EXTRACTION  2024     Allergies[2]    Family History  Problem Relation Age of Onset   Breast cancer Maternal Grandmother    Other Maternal Grandmother        hysterectomy   Kidney failure Maternal Grandmother    Allergic rhinitis Father    Diabetes Mother    Anxiety disorder Mother    Diabetes Maternal Aunt      Social History Ms. Linquist reports that she has never smoked. She has been exposed to tobacco smoke. She has never used smokeless tobacco. Ms. Darnell reports no history of  alcohol use.    Physical Examination Today's Vitals   06/06/24 1527 06/06/24 1536 06/06/24 1617  BP: (!) 140/100 (!) 148/88 124/72  Pulse: 94 94   SpO2: 99% 99%   Weight: (!) 310 lb 12.8 oz (141 kg)    Height: 5' 4 (1.626 m)     Body mass index is 53.35 kg/m.  Gen: resting comfortably, no acute distress HEENT: no scleral icterus, pupils equal round and reactive, no palptable cervical adenopathy,  CV: RRR, no mrg, no jvd Resp: Clear to auscultation bilaterally GI: abdomen is soft, non-tender, non-distended, normal bowel sounds, no hepatosplenomegaly MSK: extremities are warm, no edema.  Skin: warm, no rash Neuro:  no focal deficits Psych: appropriate affect   Diagnostic Studies  01/2024  6 day monitor   6 day monitor   Rare supraventricular ectopy in the form of isolated PACs   Rare ventricular ectopy in the form of isolated PVCs   Reported symptoms corresponded with normal sinus rhythm and very mild sinus tachycardia.   No significant arrhythmias  Assessment and Plan   1.Tachycardia/palpitations - recent benign monitor - symptoms appear more related to her anxiety, as that has been better controlled palpitations have improved. Also very high caffeine intake, we have discussed weaning  2. Chest pain - noncardiac chest pain, from description likely MSK related pain. Defer to pcp  No further cardiac testing at this time, can f/u 1 year      Dorn PHEBE Ross, M.D.     [1]  Allergies Allergen Reactions   Buspirone Other (See Comments)    Ear ringing, chest pain   Molds & Smuts Cough    Asthma flares up   Shrimp Extract Other (See Comments)    Dad is allergic so patient doesn't eat it.   [2]  Allergies Allergen Reactions   Buspirone Other (See Comments)    Ear ringing, chest pain   Molds & Smuts Cough    Asthma flares up   Shrimp Extract Other (See Comments)    Dad is allergic so patient doesn't eat it.    "

## 2024-06-06 NOTE — Patient Instructions (Addendum)
